# Patient Record
Sex: Female | Born: 1950 | Race: White | Hispanic: No | Marital: Married | Smoking: Former smoker
Health system: Southern US, Community
[De-identification: ages and names within clinical notes are randomized; demographics above are authoritative.]

## PROBLEM LIST (undated history)

## (undated) DIAGNOSIS — I10 Essential (primary) hypertension: Secondary | ICD-10-CM

## (undated) DIAGNOSIS — J189 Pneumonia, unspecified organism: Secondary | ICD-10-CM

## (undated) DIAGNOSIS — K219 Gastro-esophageal reflux disease without esophagitis: Secondary | ICD-10-CM

## (undated) DIAGNOSIS — J449 Chronic obstructive pulmonary disease, unspecified: Secondary | ICD-10-CM

## (undated) DIAGNOSIS — Z8719 Personal history of other diseases of the digestive system: Secondary | ICD-10-CM

## (undated) DIAGNOSIS — D649 Anemia, unspecified: Secondary | ICD-10-CM

## (undated) DIAGNOSIS — F32A Depression, unspecified: Secondary | ICD-10-CM

## (undated) DIAGNOSIS — R51 Headache: Secondary | ICD-10-CM

## (undated) DIAGNOSIS — G4730 Sleep apnea, unspecified: Secondary | ICD-10-CM

## (undated) DIAGNOSIS — F329 Major depressive disorder, single episode, unspecified: Secondary | ICD-10-CM

## (undated) HISTORY — PX: APPENDECTOMY: SHX54

## (undated) HISTORY — PX: DILATION AND CURETTAGE OF UTERUS: SHX78

## (undated) HISTORY — PX: CHOLECYSTECTOMY: SHX55

---

## 2013-03-26 ENCOUNTER — Institutional Professional Consult (permissible substitution): Payer: Self-pay | Admitting: Internal Medicine

## 2013-04-24 ENCOUNTER — Telehealth: Payer: Self-pay | Admitting: Internal Medicine

## 2013-04-24 ENCOUNTER — Inpatient Hospital Stay (HOSPITAL_COMMUNITY): Payer: BC Managed Care – PPO

## 2013-04-24 ENCOUNTER — Inpatient Hospital Stay (HOSPITAL_COMMUNITY)
Admission: AD | Admit: 2013-04-24 | Discharge: 2013-04-26 | DRG: 090 | Disposition: A | Discharge status: Home / Self Care | Payer: BC Managed Care – PPO | Source: Ambulatory Visit | Attending: Family Medicine | Admitting: Family Medicine

## 2013-04-24 DIAGNOSIS — K219 Gastro-esophageal reflux disease without esophagitis: Secondary | ICD-10-CM

## 2013-04-24 DIAGNOSIS — F329 Major depressive disorder, single episode, unspecified: Secondary | ICD-10-CM | POA: Diagnosis present

## 2013-04-24 DIAGNOSIS — Z87891 Personal history of nicotine dependence: Secondary | ICD-10-CM

## 2013-04-24 DIAGNOSIS — J189 Pneumonia, unspecified organism: Secondary | ICD-10-CM

## 2013-04-24 DIAGNOSIS — F3289 Other specified depressive episodes: Secondary | ICD-10-CM | POA: Diagnosis present

## 2013-04-24 DIAGNOSIS — Z23 Encounter for immunization: Secondary | ICD-10-CM

## 2013-04-24 DIAGNOSIS — Z8701 Personal history of pneumonia (recurrent): Secondary | ICD-10-CM

## 2013-04-24 DIAGNOSIS — I10 Essential (primary) hypertension: Secondary | ICD-10-CM | POA: Diagnosis present

## 2013-04-24 DIAGNOSIS — K2270 Barrett's esophagus without dysplasia: Secondary | ICD-10-CM

## 2013-04-24 HISTORY — DX: Personal history of other diseases of the digestive system: Z87.19

## 2013-04-24 HISTORY — DX: Gastro-esophageal reflux disease without esophagitis: K21.9

## 2013-04-24 HISTORY — DX: Major depressive disorder, single episode, unspecified: F32.9

## 2013-04-24 HISTORY — DX: Anemia, unspecified: D64.9

## 2013-04-24 HISTORY — DX: Chronic obstructive pulmonary disease, unspecified: J44.9

## 2013-04-24 HISTORY — DX: Depression, unspecified: F32.A

## 2013-04-24 HISTORY — DX: Sleep apnea, unspecified: G47.30

## 2013-04-24 HISTORY — DX: Headache: R51

## 2013-04-24 HISTORY — DX: Essential (primary) hypertension: I10

## 2013-04-24 HISTORY — DX: Pneumonia, unspecified organism: J18.9

## 2013-04-24 LAB — CBC WITH DIFFERENTIAL/PLATELET
Basophils Absolute: 0 10*3/uL (ref 0.0–0.1)
Basophils Relative: 0 % (ref 0–1)
Eosinophils Relative: 1 % (ref 0–5)
HCT: 33 % — ABNORMAL LOW (ref 36.0–46.0)
MCHC: 33.6 g/dL (ref 30.0–36.0)
MCV: 80.3 fL (ref 78.0–100.0)
Monocytes Absolute: 0.9 10*3/uL (ref 0.1–1.0)
Neutro Abs: 10.3 10*3/uL — ABNORMAL HIGH (ref 1.7–7.7)
Neutrophils Relative %: 75 % (ref 43–77)
Platelets: 163 10*3/uL (ref 150–400)
RBC: 4.11 MIL/uL (ref 3.87–5.11)
RDW: 13.1 % (ref 11.5–15.5)

## 2013-04-24 MED ORDER — DEXTROSE 5 % IV SOLN
1.0000 g | INTRAVENOUS | Status: DC
  Administered 2013-04-25 (×2): 1 g via INTRAVENOUS
  Filled 2013-04-24 (×3): qty 10

## 2013-04-24 MED ORDER — MORPHINE SULFATE 2 MG/ML IJ SOLN: 2.0000 mg | INTRAMUSCULAR | Status: DC | PRN

## 2013-04-24 MED ORDER — SODIUM CHLORIDE 0.9 % IJ SOLN
3.0000 mL | Freq: Two times a day (BID) | INTRAMUSCULAR | Status: DC
  Administered 2013-04-25 (×3): 3 mL via INTRAVENOUS

## 2013-04-24 MED ORDER — SODIUM CHLORIDE 0.9 % IV SOLN: 250.0000 mL | INTRAVENOUS | Status: DC | PRN

## 2013-04-24 MED ORDER — DOCUSATE SODIUM 100 MG PO CAPS
100.0000 mg | ORAL_CAPSULE | Freq: Two times a day (BID) | ORAL | Status: DC
  Administered 2013-04-25 – 2013-04-26 (×4): 100 mg via ORAL
  Filled 2013-04-24 (×5): qty 1

## 2013-04-24 MED ORDER — ZOLPIDEM TARTRATE 5 MG PO TABS: 5.0000 mg | ORAL_TABLET | Freq: Every evening | ORAL | Status: DC | PRN

## 2013-04-24 MED ORDER — SODIUM CHLORIDE 0.9 % IJ SOLN: 3.0000 mL | INTRAMUSCULAR | Status: DC | PRN

## 2013-04-24 MED ORDER — ONDANSETRON HCL 4 MG/2ML IJ SOLN: 4.0000 mg | Freq: Four times a day (QID) | INTRAMUSCULAR | Status: DC | PRN

## 2013-04-24 MED ORDER — ONDANSETRON HCL 4 MG PO TABS: 4.0000 mg | ORAL_TABLET | Freq: Four times a day (QID) | ORAL | Status: DC | PRN

## 2013-04-24 MED ORDER — DEXTROSE 5 % IV SOLN
500.0000 mg | INTRAVENOUS | Status: DC
  Administered 2013-04-25 (×2): 500 mg via INTRAVENOUS
  Filled 2013-04-24 (×3): qty 500

## 2013-04-24 NOTE — H&P (Signed)
[facility] History and Physical  [name] WUJ:[id number] DOB: [birth year]    PCP:   Deboraha Sprang Physician at [facility].  Chief Complaint: Direct admit for failed outpatient treatment of CAP.  HPI: Mallory West is an 62 y.o. female with hx of GERD, Barretts, prior significant tobacco use, recurrent left sided PNA, sent in by PCP for failed outpatient tx of CAP.   She had a course of Z pak and Augmentin, along with Rocephinx1 dose, and yet her CXR showed extension of her infiltrate to involve the LLL and lingula.  She has persistent coughs of green sputum.  She has slight shortness of breath.  She said she had several bouts of PNA in the past several months, and it has always been on the left side.  She has no new pet, distant travel, or ill contacts.  She has HTN for which she takes Ziac, but other than those PMH mentioned above, she is healthy. She denied alcohol or drug use.  She doesn't cough with eating or drinking.  Reportedly, her WBC rose to 17K.  She was asked to be direct admitted to [facility].  Rewiew of Systems:  Constitutional: Negative for malaise, fever and chills. No significant weight loss or weight gain Eyes: Negative for eye pain, redness and discharge, diplopia, visual changes, or flashes of light. ENMT: Negative for ear pain, hoarseness, nasal congestion, sinus pressure and sore throat. No headaches; tinnitus, drooling, or problem swallowing. Cardiovascular: Negative for chest pain, palpitations, diaphoresis, dyspnea and peripheral edema. ; No orthopnea, PND Respiratory: Negative for hemoptysis, wheezing and stridor.  Gastrointestinal: Negative for nausea, vomiting, diarrhea, constipation, abdominal pain, melena, blood in stool, hematemesis, jaundice and rectal bleeding.    Genitourinary: Negative for frequency, dysuria, incontinence,flank pain and hematuria; Musculoskeletal: Negative for back pain and neck pain. Negative for swelling and trauma.;  Skin: . Negative for  pruritus, rash, abrasions, bruising and skin lesion.; ulcerations Neuro: Negative for headache, lightheadedness and neck stiffness. Negative for weakness, altered level of consciousness , altered mental status, extremity weakness, burning feet, involuntary movement, seizure and syncope.  Psych: negative for anxiety, depression, insomnia, tearfulness, panic attacks, hallucinations, paranoia, suicidal or homicidal ideation    PMH  GERD, HTN, Barretts, recurrent PNAs.  Medications:  ZIAC, pending reconciliation.  HOME MEDS: Prior to Admission medications   Not on File     Allergies: NKDA  Social History: Prior Tobacco use, no drug.  Family History: No family hx of lung CA.   Physical Exam: Filed Vitals:   04/24/13 2127  BP: 107/49  Pulse: 76  Temp: 98.5 F (36.9 C)  TempSrc: Oral  Resp: 18  Height: 5\' 5"  (1.651 m)  Weight: 73.891 kg (162 lb 14.4 oz)  SpO2: 98%   Blood pressure 107/49, pulse 76, temperature 98.5 F (36.9 C), temperature source Oral, resp. rate 18, height 5\' 5"  (1.651 m), weight 73.891 kg (162 lb 14.4 oz), SpO2 98.00%.  GEN:  Pleasant  patient lying in the stretcher in no acute distress; cooperative with exam. PSYCH:  alert and oriented x4; does not appear anxious or depressed; affect is appropriate. HEENT: Mucous membranes pink and anicteric; PERRLA; EOM intact; no cervical lymphadenopathy nor thyromegaly or carotid bruit; no JVD; There were no stridor. Neck is very supple. Breasts:: Not examined CHEST WALL: No tenderness CHEST: Normal respiration, crackles on left mid and lower lung field. HEART: Regular rate and rhythm.  There are no murmur, rub, or gallops.   BACK: No kyphosis or scoliosis; no  CVA tenderness ABDOMEN: soft and non-tender; no masses, no organomegaly, normal abdominal bowel sounds; no pannus; no intertriginous candida. There is no rebound and no distention. Rectal Exam: Not done EXTREMITIES: No bone or joint deformity; age-appropriate  arthropathy of the hands and knees; no edema; no ulcerations.  There is no calf tenderness. Genitalia: not examined PULSES: 2+ and symmetric SKIN: Normal hydration no rash or ulceration CNS: Cranial nerves 2-12 grossly intact no focal lateralizing neurologic deficit.  Speech is fluent; uvula elevated with phonation, facial symmetry and tongue midline. DTR are normal bilaterally, cerebella exam is intact, barbinski is negative and strengths are equaled bilaterally.  No sensory loss.   Labs on Admission:  WBC 17K at PCP.  Assessment/Plan Present on Admission:  . HTN (hypertension) . Barrett's esophagus . GERD (gastroesophageal reflux disease) Recurrent PNA.  PLAN:  Will treat her with Rocephin and Zithromax.  Because of the recurrence of PNA on the same side, will obtain a chest CT to exclude post obt. PNA.   Will also get immunoglobulin levels and HIV.   Once her home meds are reconciled, will likely resume them.  She is stable, full code, and will be admitted to [facility] service.  Thank you for allowing me to participate in her care.  Other plans as per orders.  Code Status: FULL Unk Lightning, MD. [facility] Pager [phone] 7pm to 7am.  04/24/2013, 9:35 PM

## 2013-04-25 ENCOUNTER — Encounter (HOSPITAL_COMMUNITY): Payer: Self-pay | Admitting: General Practice

## 2013-04-25 DIAGNOSIS — K219 Gastro-esophageal reflux disease without esophagitis: Secondary | ICD-10-CM

## 2013-04-25 DIAGNOSIS — K2270 Barrett's esophagus without dysplasia: Secondary | ICD-10-CM

## 2013-04-25 DIAGNOSIS — I10 Essential (primary) hypertension: Secondary | ICD-10-CM

## 2013-04-25 DIAGNOSIS — J189 Pneumonia, unspecified organism: Principal | ICD-10-CM

## 2013-04-25 LAB — COMPREHENSIVE METABOLIC PANEL
ALT: 17 U/L (ref 0–35)
Alkaline Phosphatase: 92 U/L (ref 39–117)
BUN: 8 mg/dL (ref 6–23)
Chloride: 102 mEq/L (ref 96–112)
GFR calc Af Amer: 85 mL/min — ABNORMAL LOW (ref >90)
Glucose, Bld: 104 mg/dL — ABNORMAL HIGH (ref 70–99)
Potassium: 3.7 mEq/L (ref 3.5–5.1)
Sodium: 140 mEq/L (ref 135–145)
Total Bilirubin: 0.3 mg/dL (ref 0.3–1.2)
Total Protein: 6.6 g/dL (ref 6.0–8.3)

## 2013-04-25 LAB — HIV ANTIBODY (ROUTINE TESTING W REFLEX): HIV: NONREACTIVE

## 2013-04-25 MED ORDER — HYDROCHLOROTHIAZIDE 10 MG/ML ORAL SUSPENSION
6.2500 mg | Freq: Every day | ORAL | Status: DC
  Administered 2013-04-26: 6.25 mg via ORAL
  Filled 2013-04-25 (×2): qty 1.25

## 2013-04-25 MED ORDER — CYCLOBENZAPRINE HCL 10 MG PO TABS
10.0000 mg | ORAL_TABLET | Freq: Every day | ORAL | Status: DC
Start: 2013-04-25 — End: 2013-04-26
  Filled 2013-04-25 (×2): qty 1

## 2013-04-25 MED ORDER — BISOPROLOL FUMARATE 5 MG PO TABS
5.0000 mg | ORAL_TABLET | Freq: Every day | ORAL | Status: DC
  Administered 2013-04-26: 10:00:00 5 mg via ORAL
  Filled 2013-04-25 (×2): qty 1

## 2013-04-25 MED ORDER — ACETAMINOPHEN 500 MG PO TABS
500.0000 mg | ORAL_TABLET | Freq: Four times a day (QID) | ORAL | Status: DC | PRN
  Administered 2013-04-25: 500 mg via ORAL
  Filled 2013-04-25: qty 1

## 2013-04-25 MED ORDER — ENOXAPARIN SODIUM 40 MG/0.4ML ~~LOC~~ SOLN
40.0000 mg | SUBCUTANEOUS | Status: DC
  Filled 2013-04-25 (×2): qty 0.4

## 2013-04-25 MED ORDER — METHADONE HCL 10 MG PO TABS: 30.0000 mg | ORAL_TABLET | ORAL | Status: DC

## 2013-04-25 MED ORDER — BISOPROLOL-HYDROCHLOROTHIAZIDE 5-6.25 MG PO TABS: 1.0000 | ORAL_TABLET | Freq: Every day | ORAL | Status: DC

## 2013-04-25 MED ORDER — METHADONE HCL 10 MG PO TABS
60.0000 mg | ORAL_TABLET | Freq: Every day | ORAL | Status: DC
  Administered 2013-04-26: 60 mg via ORAL
  Filled 2013-04-25: qty 6

## 2013-04-25 MED ORDER — FLUCONAZOLE 150 MG PO TABS
150.0000 mg | ORAL_TABLET | Freq: Once | ORAL | Status: AC
  Administered 2013-04-26: 02:00:00 150 mg via ORAL
  Filled 2013-04-25: qty 1

## 2013-04-25 MED ORDER — FLUOXETINE HCL 20 MG PO TABS
40.0000 mg | ORAL_TABLET | Freq: Every day | ORAL | Status: DC
  Administered 2013-04-26: 40 mg via ORAL
  Filled 2013-04-25 (×2): qty 2

## 2013-04-25 MED ORDER — BUPROPION HCL ER (SR) 100 MG PO TB12
200.0000 mg | ORAL_TABLET | Freq: Every day | ORAL | Status: DC
  Administered 2013-04-26: 200 mg via ORAL
  Filled 2013-04-25 (×2): qty 2

## 2013-04-25 MED ORDER — POLYETHYLENE GLYCOL 3350 17 G PO PACK
17.0000 g | PACK | Freq: Every day | ORAL | Status: DC
  Administered 2013-04-25 – 2013-04-26 (×2): 17 g via ORAL
  Filled 2013-04-25 (×3): qty 1

## 2013-04-25 NOTE — Progress Notes (Signed)
[facility] PROGRESS NOTE  Mallory West ZOX:[id number] DOB: 03-Jan-1951 DOA: 04/24/2013 PCP: Pcp Not In System  Assessment/Plan: 1. Recurrent Pneumonia- secondary to ? aspiration . Patient has severe GERD and complains of waking from sleep with bad taste in mouth from reflux. Will continue with rocephin and zithromax 2. GERD- continue Dexilant 3. Hypertension- Continue Ziac, BP is well controlled at this time. 4. Depression- Continue Prozac and Wellbutrin  Code Status: *Full code Family Communication: *Discussed with patient and her husband at bedside Disposition Plan: Home when stable   Consultants:  None  Procedures:  None  Antibiotics:  Rocephin  Zithromax  HPI/Subjective: Patient seen and examined, admitted with recurrent pneumonia.She does have h/o GERD.  Objective: Filed Vitals:   04/25/13 0800  BP: 132/75  Pulse: 84  Temp: 98.3 F (36.8 C)  Resp: 16   No intake or output data in the 24 hours ending 04/25/13 1504 Filed Weights   04/24/13 2127  Weight: 73.891 kg (162 lb 14.4 oz)    Exam:   General:  Appears in no acute distress  Cardiovascular: S1S2 RRR  Respiratory: Clear bilaterally  Abdomen: Soft, nontender, no organomegaly  Musculoskeletal: No edema  Data Reviewed: Basic Metabolic Panel:  Recent Labs Lab 04/25/13 0641  NA 140  K 3.7  CL 102  CO2 29  GLUCOSE 104*  BUN 8  CREATININE 0.83  CALCIUM 8.7   Liver Function Tests:  Recent Labs Lab 04/25/13 0641  AST 17  ALT 17  ALKPHOS 92  BILITOT 0.3  PROT 6.6  ALBUMIN 3.0*   No results found for this basename: LIPASE, AMYLASE,  in the last 168 hours No results found for this basename: AMMONIA,  in the last 168 hours CBC:  Recent Labs Lab 04/24/13 2237  WBC 13.7*  NEUTROABS 10.3*  HGB 11.1*  HCT 33.0*  MCV 80.3  PLT 163   Cardiac Enzymes: No results found for this basename: CKTOTAL, CKMB, CKMBINDEX, TROPONINI,  in the last 168 hours BNP (last 3 results) No  results found for this basename: PROBNP,  in the last 8760 hours CBG: No results found for this basename: GLUCAP,  in the last 168 hours  No results found for this or any previous visit (from the past 240 hour(s)).   Studies: Ct Chest Wo Contrast  04/24/2013   CLINICAL DATA:  Left-sided chest pain. Recurrent pneumonia. Shortness of breath.  EXAM: CT CHEST WITHOUT CONTRAST  TECHNIQUE: Multidetector CT imaging of the chest was performed following the standard protocol without IV contrast.  COMPARISON:  04/24/2013 chest radiograph at [facility] at [facility]  FINDINGS: There are multiple, bilateral areas of patchy airspace opacity with bronchial wall thickening and interspersed ground-glass airspace opacity predominantly in the lingula and the left lower lobe. No pleural effusion.  Small pleural and suspect small pericardial effusion is present. Cholecystectomy clips partly visualized. Small hiatal hernia. Small mediastinal lymph nodes are identified.  No acute osseous finding.  IMPRESSION: Multilobar patchy airspace opacities most prominent in the lingula and left lower lobe with a configuration most typical for bronchopneumonia. Aspiration or hemorrhage could have a similar appearance. Radiographic resolution may take 4-6 weeks.   Electronically Signed   By: Christiana Pellant M.D.   On: 04/24/2013 23:24    Scheduled Meds: . azithromycin  500 mg Intravenous Q24H  . cefTRIAXone (ROCEPHIN)  IV  1 g Intravenous Q24H  . docusate sodium  100 mg Oral BID  . polyethylene glycol  17 g Oral Daily  .  sodium chloride  3 mL Intravenous Q12H   Continuous Infusions:   Principal Problem:   CAP (community acquired pneumonia) Active Problems:   HTN (hypertension)   History of tobacco abuse   Barrett's esophagus   GERD (gastroesophageal reflux disease)    Time spent: 25 min    [facility] S  [facility] Pager [phone]. If 7PM-7AM, please contact night-coverage at [email or web address],  password [facility] 04/25/2013, 3:04 PM  LOS: 1 day

## 2013-04-26 LAB — CBC WITH DIFFERENTIAL/PLATELET
Basophils Absolute: 0 10*3/uL (ref 0.0–0.1)
Basophils Relative: 0 % (ref 0–1)
Eosinophils Absolute: 0.2 10*3/uL (ref 0.0–0.7)
HCT: 35.1 % — ABNORMAL LOW (ref 36.0–46.0)
MCH: 27 pg (ref 26.0–34.0)
MCHC: 33 g/dL (ref 30.0–36.0)
Monocytes Absolute: 0.5 10*3/uL (ref 0.1–1.0)
Neutro Abs: 5.5 10*3/uL (ref 1.7–7.7)
Neutrophils Relative %: 73 % (ref 43–77)
RDW: 13.6 % (ref 11.5–15.5)
WBC: 7.5 10*3/uL (ref 4.0–10.5)

## 2013-04-26 MED ORDER — POLYETHYLENE GLYCOL 3350 17 G PO PACK: 17.0000 g | PACK | Freq: Every day | ORAL | Status: DC

## 2013-04-26 MED ORDER — MOXIFLOXACIN HCL 400 MG PO TABS: 400.0000 mg | ORAL_TABLET | Freq: Every day | ORAL | Status: DC

## 2013-04-26 NOTE — Discharge Summary (Signed)
Physician Discharge Summary  Mallory West [facility] YNW:[id number] DOB: 22-Jul-1951 DOA: 04/24/2013  PCP: Pcp Not In System  Admit date: 04/24/2013 Discharge date: 04/26/2013  Time spent: 50* minutes  Recommendations for Outpatient Follow-up:  1. *Follow up [facility] in 2 weels 2. Follow up PCP in 2 weeks  Discharge Diagnoses:  Principal Problem:   CAP (community acquired pneumonia) Active Problems:   HTN (hypertension)   History of tobacco abuse   Barrett's esophagus   GERD (gastroesophageal reflux disease)   Discharge Condition: *Stable  Diet recommendation: *Low salt diet  Filed Weights   04/24/13 2127  Weight: 73.891 kg (162 lb 14.4 oz)    History of present illness:  62 y.o. female with hx of GERD, Barretts, prior significant tobacco use, recurrent left sided PNA, sent in by PCP for failed outpatient tx of CAP. She had a course of Z pak and Augmentin, along with Rocephinx1 dose, and yet her CXR showed extension of her infiltrate to involve the LLL and lingula. She has persistent coughs of green sputum. She has slight shortness of breath. She said she had several bouts of PNA in the past several months, and it has always been on the left side. She has no new pet, distant travel, or ill contacts. She has HTN for which she takes Ziac, but other than those PMH mentioned above, she is healthy. She denied alcohol or drug use. She doesn't cough with eating or drinking. Reportedly, her WBC rose to 17K. She was asked to be direct admitted to [facility].      Hospital Course:   Recurrent pneumonia- CT chest was obtained as she has had multiple episodes of pneumonia. Discussed the CT Chest on phone with Dr Herma Carson pulmonary, and he recommends pulmonary follow up. They will call to make an appointment as outpatient. Patient has sever GERD so recurrent aspiration is one of the differential. Will discharge her on Avelox 400 mg po daily x 8 days. She was started on Rocephin and zithromax in the hospital.WBC is  now back to normal.  GERD; Continue with Dexilant  Hypertension-Continue with Ziac  Depression- Continue Prozac and Wellbutrin     Procedures:  *None  Consultations:  None  Discharge Exam: Filed Vitals:   04/26/13 0852  BP: 118/62  Pulse: 76  Temp: 98.5 F (36.9 C)  Resp: 18    General: *Appear in no acute distress Cardiovascular: *S1s2 RRR Respiratory: *Clear bilaterally Abdomen: Soft, nontender Ext : No edema  Discharge Instructions  Discharge Orders   Future Orders Complete By Expires   Diet - low sodium heart healthy  As directed    Increase activity slowly  As directed        Medication List         acetaminophen 500 MG tablet  Commonly known as:  TYLENOL  Take 1,000 mg by mouth every 6 (six) hours as needed for fever.     bisoprolol-hydrochlorothiazide 5-6.25 MG per tablet  Commonly known as:  ZIAC  Take 1 tablet by mouth daily.     buPROPion 100 MG 12 hr tablet  Commonly known as:  WELLBUTRIN SR  Take 200 mg by mouth daily.     cyclobenzaprine 10 MG tablet  Commonly known as:  FLEXERIL  Take 10 mg by mouth at bedtime.     dexlansoprazole 60 MG capsule  Commonly known as:  DEXILANT  Take 60 mg by mouth daily.     docusate sodium 50 MG capsule  Commonly known as:  COLACE  Take 250 mg by mouth at bedtime.     FLUoxetine 20 MG tablet  Commonly known as:  PROZAC  Take 40 mg by mouth daily.     methadone 10 MG tablet  Commonly known as:  DOLOPHINE  Take 30-60 mg by mouth every 8 (eight) hours. Take six in the morning and three at night     moxifloxacin 400 MG tablet  Commonly known as:  AVELOX  Take 1 tablet (400 mg total) by mouth daily.     polyethylene glycol packet  Commonly known as:  MIRALAX / GLYCOLAX  Take 17 g by mouth daily.     PROBIOTIC DAILY PO  Take 1 tablet by mouth at bedtime.       No Known Allergies    The results of significant diagnostics from this hospitalization (including imaging, microbiology,  ancillary and laboratory) are listed below for reference.    Significant Diagnostic Studies: Ct Chest Wo Contrast  04/24/2013   CLINICAL DATA:  Left-sided chest pain. Recurrent pneumonia. Shortness of breath.  EXAM: CT CHEST WITHOUT CONTRAST  TECHNIQUE: Multidetector CT imaging of the chest was performed following the standard protocol without IV contrast.  COMPARISON:  04/24/2013 chest radiograph at [facility] at [facility]  FINDINGS: There are multiple, bilateral areas of patchy airspace opacity with bronchial wall thickening and interspersed ground-glass airspace opacity predominantly in the lingula and the left lower lobe. No pleural effusion.  Small pleural and suspect small pericardial effusion is present. Cholecystectomy clips partly visualized. Small hiatal hernia. Small mediastinal lymph nodes are identified.  No acute osseous finding.  IMPRESSION: Multilobar patchy airspace opacities most prominent in the lingula and left lower lobe with a configuration most typical for bronchopneumonia. Aspiration or hemorrhage could have a similar appearance. Radiographic resolution may take 4-6 weeks.   Electronically Signed   By: Christiana Pellant M.D.   On: 04/24/2013 23:24    Microbiology: No results found for this or any previous visit (from the past 240 hour(s)).   Labs: Basic Metabolic Panel:  Recent Labs Lab 04/25/13 0641  NA 140  K 3.7  CL 102  CO2 29  GLUCOSE 104*  BUN 8  CREATININE 0.83  CALCIUM 8.7   Liver Function Tests:  Recent Labs Lab 04/25/13 0641  AST 17  ALT 17  ALKPHOS 92  BILITOT 0.3  PROT 6.6  ALBUMIN 3.0*   No results found for this basename: LIPASE, AMYLASE,  in the last 168 hours No results found for this basename: AMMONIA,  in the last 168 hours CBC:  Recent Labs Lab 04/24/13 2237 04/26/13 1000  WBC 13.7* 7.5  NEUTROABS 10.3* 5.5  HGB 11.1* 11.6*  HCT 33.0* 35.1*  MCV 80.3 81.8  PLT 163 160   Cardiac Enzymes: No results found  for this basename: CKTOTAL, CKMB, CKMBINDEX, TROPONINI,  in the last 168 hours BNP: BNP (last 3 results) No results found for this basename: PROBNP,  in the last 8760 hours CBG: No results found for this basename: GLUCAP,  in the last 168 hours     Signed:  Fonnie Crookshanks S  [facility] 04/26/2013, 11:59 AM

## 2013-04-27 NOTE — Progress Notes (Signed)
Utilization review completed. Raphel Stickles RN CCM Case Mgmt  

## 2013-04-28 ENCOUNTER — Institutional Professional Consult (permissible substitution): Payer: BC Managed Care – PPO | Admitting: Internal Medicine

## 2013-04-28 LAB — PROTEIN ELECTROPH W RFLX QUANT IMMUNOGLOBULINS
Albumin ELP: 53.9 % — ABNORMAL LOW (ref 55.8–66.1)
Alpha-1-Globulin: 7.3 % — ABNORMAL HIGH (ref 2.9–4.9)
Alpha-2-Globulin: 12.1 % — ABNORMAL HIGH (ref 7.1–11.8)
Beta 2: 4.4 % (ref 3.2–6.5)
M-Spike, %: NOT DETECTED g/dL
Total Protein ELP: 7.1 g/dL (ref 6.0–8.3)

## 2013-04-29 ENCOUNTER — Institutional Professional Consult (permissible substitution): Payer: BC Managed Care – PPO | Admitting: Internal Medicine

## 2013-05-04 ENCOUNTER — Institutional Professional Consult (permissible substitution): Payer: BC Managed Care – PPO | Admitting: Internal Medicine

## 2013-05-11 ENCOUNTER — Telehealth: Payer: Self-pay | Admitting: Internal Medicine

## 2013-05-11 ENCOUNTER — Ambulatory Visit (INDEPENDENT_AMBULATORY_CARE_PROVIDER_SITE_OTHER): Payer: BC Managed Care – PPO | Admitting: Internal Medicine

## 2013-05-11 ENCOUNTER — Ambulatory Visit (INDEPENDENT_AMBULATORY_CARE_PROVIDER_SITE_OTHER)
Admission: RE | Admit: 2013-05-11 | Discharge: 2013-05-11 | Disposition: A | Discharge status: Home / Self Care | Payer: BC Managed Care – PPO | Source: Ambulatory Visit | Attending: Internal Medicine | Admitting: Internal Medicine

## 2013-05-11 ENCOUNTER — Encounter: Payer: Self-pay | Admitting: Internal Medicine

## 2013-05-11 VITALS — BP 118/70 | HR 84 | Temp 98.3°F | Ht 65.0 in | Wt 165.4 lb

## 2013-05-11 DIAGNOSIS — Z23 Encounter for immunization: Secondary | ICD-10-CM

## 2013-05-11 DIAGNOSIS — J189 Pneumonia, unspecified organism: Secondary | ICD-10-CM

## 2013-05-11 NOTE — Telephone Encounter (Signed)
Pt will need to sign a medical release in order for [place] to do so. LMTCB x1 for pt to make her aware.

## 2013-05-11 NOTE — Patient Instructions (Signed)
Please see patient coordinator before you leave today  to schedule sinus CT and records from Dr Excell Seltzer  GERD (REFLUX)  is an extremely common cause of respiratory symptoms, many times with no significant heartburn at all.    It can be treated with medication, but also with lifestyle changes including avoidance of late meals, excessive alcohol, smoking cessation, and avoid fatty foods, chocolate, peppermint, colas, red wine, and acidic juices such as orange juice.  NO MINT OR MENTHOL PRODUCTS SO NO COUGH DROPS  USE SUGARLESS CANDY INSTEAD (jolley ranchers or Production designer, theatre/television/film)  NO OIL BASED VITAMINS - use powdered substitutes.  Please remember to go to the   x-ray department downstairs for your tests - we will call you with the results when they are available.     Please schedule a follow up office visit in 6 weeks, call sooner if needed

## 2013-05-11 NOTE — Progress Notes (Signed)
Subjective:    Patient ID: Mallory West, female    DOB: 1950/07/31   MRN: [id number]  HPI  89  yowf quit smoking 1999 with background hx of recurrent pna starting 7 then in her 40's every couple of years seemed worse p quit smoking always cleared between (though 4 x in 2013) then 5 x 2014 but persistent daily symptoms of cough x green mucus x 02/2013 w/u by pulmonary doctor in [facility] Feb 2014> "he never said what he though was wrong" failed outpt rx with augmentin/ zpak/ rocephin and admitted  Admit date: 04/24/2013  Discharge date: 04/26/2013  Time spent: 50* minutes  Recommendations for Outpatient Follow-up:  1. *Follow up [facility] in 2 weels 2. Follow up PCP in 2 weeks Discharge Diagnoses:  Principal Problem:  CAP (community acquired pneumonia) > rx Avelox  Active Problems:  HTN (hypertension)  History of tobacco abuse  Barrett's esophagus  GERD (gastroesophageal reflux disease)   05/11/2013 [facility] office visit/ Haidan Nhan cc daily cough x 2 months worst first thing am > dry after last abx x one week, no more fever.  Has h/o Barrett's on Reglan but couldn't tol it and no sense of HB/ asp on acid suppression and metadone daily.  Does have chronic nasal congestion and sinus pressure worst in am's.  No obvious day to day or daytime variabilty or assoc chronic cough or cp or chest tightness, subjective wheeze overt   hb symptoms. No unusual exp hx or h/o childhood pna/ asthma or knowledge of premature birth.  Sleeping ok without nocturnal  or early am exacerbation  of respiratory  c/o's or need for noct saba. Also denies any obvious fluctuation of symptoms with weather or environmental changes or other aggravating or alleviating factors except as outlined above   Current Medications, Allergies, Complete Past Medical History, Past Surgical History, Family History, and Social History were reviewed in [employer] record.          Review of Systems   Constitutional: Negative for fever and unexpected weight change.  HENT: Positive for congestion, postnasal drip and sinus pressure. Negative for dental problem, ear pain, nosebleeds, rhinorrhea, sneezing, sore throat and trouble swallowing.   Eyes: Negative for redness and itching.  Respiratory: Positive for cough. Negative for chest tightness, shortness of breath and wheezing.   Cardiovascular: Negative for palpitations and leg swelling.  Gastrointestinal: Negative for nausea and vomiting.  Genitourinary: Negative for dysuria.  Musculoskeletal: Negative for joint swelling.  Skin: Negative for rash.  Neurological: Positive for headaches.  Hematological: Does not bruise/bleed easily.  Psychiatric/Behavioral: Positive for dysphoric mood.       Objective:   Physical Exam  Wt Readings from Last 3 Encounters:  05/11/13 165 lb 6.4 oz (75.025 kg)  04/24/13 162 lb 14.4 oz (73.891 kg)    Pleasant amb wf with peppermint in mouth  HEENT: nl dentition, turbinates, and orophanx. Nl external ear canals without cough reflex   NECK :  without JVD/Nodes/TM/ nl carotid upstrokes bilaterally   LUNGS: no acc muscle use, clear to A and P bilaterally without cough on insp or exp maneuvers   CV:  RRR  no s3 or murmur or increase in P2, no edema   ABD:  soft and nontender with nl excursion in the supine position. No bruits or organomegaly, bowel sounds nl  MS:  warm without deformities, calf tenderness, cyanosis or clubbing  SKIN: warm and dry without lesions    NEURO:  alert, approp, no deficits      04/24/13 CT chest Multilobar patchy airspace opacities most prominent in the lingula  and left lower lobe with a configuration most typical for  bronchopneumonia.  05/11/13 cxr Improvement in the left lower lobe pneumonia       Assessment & Plan:

## 2013-05-12 NOTE — Telephone Encounter (Signed)
Records are in your lookat  The pt is aware She would like for you to review and advise on test results that were sent She states that she moved before getting any results Please advise, thanks!

## 2013-05-12 NOTE — Assessment & Plan Note (Signed)
Agree that gerd/ asp syndromes highest on the list esp since has h/o barrett's and is on methadone which may reduce the gag reflex when supine  Since already on acid suppression needs to do more to modify lifestyle/ diet / reviewed    Each maintenance medication was reviewed in detail including most importantly the difference between maintenance and as needed and under what circumstances the prns are to be used.  Please see instructions for details which were reviewed in writing and the patient given a copy.    Records requested, sinus ct pending/  rx also with flu and pneuomax and plan Prevar at 63

## 2013-05-12 NOTE — Telephone Encounter (Signed)
Called, spoke with pt.  She was seen yesterday.  Pt states she did sign release yesterday to have this taken care of.  She would like to ensure this is received.  Verlon Au, will you please let us know when you see this come across so we can let pt know?  Thank you.

## 2013-05-13 ENCOUNTER — Encounter: Payer: Self-pay | Admitting: Internal Medicine

## 2013-05-13 DIAGNOSIS — G4734 Idiopathic sleep related nonobstructive alveolar hypoventilation: Secondary | ICD-10-CM | POA: Insufficient documentation

## 2013-05-13 NOTE — Telephone Encounter (Signed)
Pt is aware that all MW has received is the initial evaluation. Do you have the records release that the pt signed?

## 2013-05-13 NOTE — Telephone Encounter (Signed)
Let her know all I have so far is the initial eval, none of the results from the tests he ordered, so have resubmitted request today

## 2013-05-13 NOTE — Telephone Encounter (Signed)
The labs were ok but not that extensive > we will regroup and decide on next steps at next ov  The 02 was low enough to rec 02 when test was done but if breathing better now that when test was done ok to try off and see how she feels

## 2013-05-13 NOTE — Telephone Encounter (Addendum)
Called and spoke with the pt  I advised that we received her initial eval- ov note, cxr report and ONO on RA But, in the ov note the doc rec that she have PFT and CT Chest Pt states that these were never done, but she did have some labs done  I called and spoke with Denny Peon at Dr [employer] office and verified this  She states no PFT or CT Chest were done, but she will fax the labs  Will hold in my basket until received  Received the labs- CBC, IGG, IGA and IGM   Dr Sherene Sires, I put these records back in your lookat Pt wants you to review them and advise any recs- she is not aware if needs to cont noct o2 since she was never called with ONO RA results  Also note that she did have a CT Chest here on 04/24/13, thanks!

## 2013-05-13 NOTE — Telephone Encounter (Signed)
Spoke with pt and notified of results/recs per Dr. Sherene Sires. Pt verbalized understanding and denied any questions. She will try off o2 and will discuss this at next visit

## 2013-05-14 ENCOUNTER — Other Ambulatory Visit: Payer: BC Managed Care – PPO

## 2013-05-20 ENCOUNTER — Other Ambulatory Visit: Payer: BC Managed Care – PPO

## 2013-05-27 ENCOUNTER — Ambulatory Visit (INDEPENDENT_AMBULATORY_CARE_PROVIDER_SITE_OTHER)
Admission: RE | Admit: 2013-05-27 | Discharge: 2013-05-27 | Disposition: A | Discharge status: Home / Self Care | Payer: BC Managed Care – PPO | Source: Ambulatory Visit | Attending: Internal Medicine | Admitting: Internal Medicine

## 2013-05-27 ENCOUNTER — Encounter: Payer: Self-pay | Admitting: Internal Medicine

## 2013-05-27 DIAGNOSIS — J189 Pneumonia, unspecified organism: Secondary | ICD-10-CM

## 2013-05-27 NOTE — Progress Notes (Signed)
Quick Note:  Spoke with pt and notified of results per Dr. Wert. Pt verbalized understanding and denied any questions.  ______ 

## 2013-06-22 ENCOUNTER — Ambulatory Visit: Payer: BC Managed Care – PPO | Admitting: Internal Medicine

## 2013-06-24 ENCOUNTER — Ambulatory Visit: Payer: BC Managed Care – PPO | Admitting: Internal Medicine

## 2013-12-10 ENCOUNTER — Telehealth: Payer: Self-pay | Admitting: Cardiology

## 2013-12-10 NOTE — Telephone Encounter (Signed)
[facility] [phone]  5.21.15/km

## 2013-12-31 ENCOUNTER — Encounter: Payer: Self-pay | Admitting: *Deleted

## 2014-01-04 ENCOUNTER — Ambulatory Visit: Payer: BC Managed Care – PPO | Admitting: Cardiology

## 2014-01-24 ENCOUNTER — Inpatient Hospital Stay (HOSPITAL_COMMUNITY)
Admission: EM | Admit: 2014-01-24 | Discharge: 2014-01-25 | DRG: 065 | Disposition: A | Discharge status: Home / Self Care | Payer: BC Managed Care – PPO | Attending: Internal Medicine | Admitting: Internal Medicine

## 2014-01-24 ENCOUNTER — Encounter (HOSPITAL_COMMUNITY): Payer: Self-pay | Admitting: Emergency Medicine

## 2014-01-24 ENCOUNTER — Emergency Department (HOSPITAL_COMMUNITY): Payer: BC Managed Care – PPO

## 2014-01-24 DIAGNOSIS — I10 Essential (primary) hypertension: Secondary | ICD-10-CM | POA: Diagnosis present

## 2014-01-24 DIAGNOSIS — G459 Transient cerebral ischemic attack, unspecified: Secondary | ICD-10-CM

## 2014-01-24 DIAGNOSIS — Z87891 Personal history of nicotine dependence: Secondary | ICD-10-CM

## 2014-01-24 DIAGNOSIS — I639 Cerebral infarction, unspecified: Secondary | ICD-10-CM | POA: Diagnosis present

## 2014-01-24 DIAGNOSIS — J449 Chronic obstructive pulmonary disease, unspecified: Secondary | ICD-10-CM | POA: Diagnosis present

## 2014-01-24 DIAGNOSIS — R200 Anesthesia of skin: Secondary | ICD-10-CM

## 2014-01-24 DIAGNOSIS — Z79899 Other long term (current) drug therapy: Secondary | ICD-10-CM

## 2014-01-24 DIAGNOSIS — Z881 Allergy status to other antibiotic agents status: Secondary | ICD-10-CM

## 2014-01-24 DIAGNOSIS — G4733 Obstructive sleep apnea (adult) (pediatric): Secondary | ICD-10-CM | POA: Diagnosis present

## 2014-01-24 DIAGNOSIS — G458 Other transient cerebral ischemic attacks and related syndromes: Secondary | ICD-10-CM

## 2014-01-24 DIAGNOSIS — K2270 Barrett's esophagus without dysplasia: Secondary | ICD-10-CM

## 2014-01-24 DIAGNOSIS — J4489 Other specified chronic obstructive pulmonary disease: Secondary | ICD-10-CM | POA: Diagnosis present

## 2014-01-24 DIAGNOSIS — Z9089 Acquired absence of other organs: Secondary | ICD-10-CM

## 2014-01-24 DIAGNOSIS — I6350 Cerebral infarction due to unspecified occlusion or stenosis of unspecified cerebral artery: Principal | ICD-10-CM | POA: Diagnosis present

## 2014-01-24 DIAGNOSIS — J189 Pneumonia, unspecified organism: Secondary | ICD-10-CM

## 2014-01-24 DIAGNOSIS — E785 Hyperlipidemia, unspecified: Secondary | ICD-10-CM | POA: Diagnosis present

## 2014-01-24 DIAGNOSIS — F329 Major depressive disorder, single episode, unspecified: Secondary | ICD-10-CM | POA: Diagnosis present

## 2014-01-24 DIAGNOSIS — Z800 Family history of malignant neoplasm of digestive organs: Secondary | ICD-10-CM

## 2014-01-24 DIAGNOSIS — I5032 Chronic diastolic (congestive) heart failure: Secondary | ICD-10-CM | POA: Diagnosis present

## 2014-01-24 DIAGNOSIS — Z823 Family history of stroke: Secondary | ICD-10-CM

## 2014-01-24 DIAGNOSIS — F3289 Other specified depressive episodes: Secondary | ICD-10-CM | POA: Diagnosis present

## 2014-01-24 DIAGNOSIS — R209 Unspecified disturbances of skin sensation: Secondary | ICD-10-CM | POA: Diagnosis present

## 2014-01-24 DIAGNOSIS — F32A Depression, unspecified: Secondary | ICD-10-CM | POA: Diagnosis present

## 2014-01-24 DIAGNOSIS — G451 Carotid artery syndrome (hemispheric): Secondary | ICD-10-CM

## 2014-01-24 DIAGNOSIS — Z7982 Long term (current) use of aspirin: Secondary | ICD-10-CM

## 2014-01-24 DIAGNOSIS — K219 Gastro-esophageal reflux disease without esophagitis: Secondary | ICD-10-CM | POA: Diagnosis present

## 2014-01-24 DIAGNOSIS — Z886 Allergy status to analgesic agent status: Secondary | ICD-10-CM

## 2014-01-24 LAB — CBC
HEMATOCRIT: 37.7 % (ref 36.0–46.0)
Hemoglobin: 12.5 g/dL (ref 12.0–15.0)
MCH: 27 pg (ref 26.0–34.0)
MCHC: 33.2 g/dL (ref 30.0–36.0)
MCV: 81.4 fL (ref 78.0–100.0)
Platelets: 183 10*3/uL (ref 150–400)
RBC: 4.63 MIL/uL (ref 3.87–5.11)
RDW: 12.9 % (ref 11.5–15.5)
WBC: 9.3 10*3/uL (ref 4.0–10.5)

## 2014-01-24 LAB — BASIC METABOLIC PANEL
Anion gap: 15 (ref 5–15)
BUN: 8 mg/dL (ref 6–23)
CHLORIDE: 96 meq/L (ref 96–112)
CO2: 24 meq/L (ref 19–32)
CREATININE: 1.06 mg/dL (ref 0.50–1.10)
Calcium: 9.7 mg/dL (ref 8.4–10.5)
GFR calc Af Amer: 63 mL/min — ABNORMAL LOW (ref >90)
GFR calc non Af Amer: 54 mL/min — ABNORMAL LOW (ref >90)
Glucose, Bld: 129 mg/dL — ABNORMAL HIGH (ref 70–99)
Potassium: 4.3 mEq/L (ref 3.7–5.3)
Sodium: 135 mEq/L — ABNORMAL LOW (ref 137–147)

## 2014-01-24 LAB — URINALYSIS, ROUTINE W REFLEX MICROSCOPIC
BILIRUBIN URINE: NEGATIVE
Glucose, UA: NEGATIVE mg/dL
HGB URINE DIPSTICK: NEGATIVE
Ketones, ur: NEGATIVE mg/dL
NITRITE: NEGATIVE
PROTEIN: NEGATIVE mg/dL
Specific Gravity, Urine: 1.004 — ABNORMAL LOW (ref 1.005–1.030)
UROBILINOGEN UA: 0.2 mg/dL (ref 0.0–1.0)
pH: 5.5 (ref 5.0–8.0)

## 2014-01-24 LAB — RAPID URINE DRUG SCREEN, HOSP PERFORMED
AMPHETAMINES: NOT DETECTED
BARBITURATES: NOT DETECTED
Benzodiazepines: NOT DETECTED
COCAINE: NOT DETECTED
OPIATES: NOT DETECTED
Tetrahydrocannabinol: NOT DETECTED

## 2014-01-24 LAB — PROTIME-INR
INR: 1.01 (ref 0.00–1.49)
Prothrombin Time: 13.3 seconds (ref 11.6–15.2)

## 2014-01-24 LAB — URINE MICROSCOPIC-ADD ON

## 2014-01-24 LAB — TROPONIN I: Troponin I: <0.3 ng/mL (ref <0.30)

## 2014-01-24 LAB — APTT: aPTT: 29 seconds (ref 24–37)

## 2014-01-24 LAB — ETHANOL: Alcohol, Ethyl (B): <11 mg/dL (ref 0–11)

## 2014-01-24 NOTE — ED Notes (Signed)
Doctors notified of patient.

## 2014-01-24 NOTE — ED Provider Notes (Signed)
CSN: [id number]     Arrival date & time 01/24/14  1957 History   First MD Initiated Contact with Patient 01/24/14 2108     Chief Complaint  Patient presents with  . Numbness     (Consider location/radiation/quality/duration/timing/severity/associated sxs/prior Treatment) HPI Pt presents with c/o numbness of her right face and right side of tongue/lips as well as numbness of right hand.  She states symptoms began this morning approx 9am and lasted 30 minutes.  Then resolved, recurred tonight approx 7pm and lasted approx one hour. Now only has numbness on right side of her lips.  No changes in vision or speech.  No focal weakness. No fever/chills.  No headache.  She has not had sympotms similar to this in the past.  There are no other associated systemic symptoms, there are no other alleviating or modifying factors.   Past Medical History  Diagnosis Date  . Hypertension   . Depression   . COPD (chronic obstructive pulmonary disease)   . Pneumonia   . Sleep apnea   . GERD (gastroesophageal reflux disease)   . H/O hiatal hernia   . Headache(784.0)   . Anemia    Past Surgical History  Procedure Laterality Date  . Cholecystectomy    . Appendectomy    . Dilation and curettage of uterus     Family History  Problem Relation Age of Onset  . Colon cancer Maternal Grandmother   . Stroke Father    History  Substance Use Topics  . Smoking status: Former Smoker -- 2.00 packs/day for 30 years    Types: Cigarettes    Quit date: 01/23/1998  . Smokeless tobacco: Never Used  . Alcohol Use: No   OB History   Grav Para Term Preterm Abortions TAB SAB Ect Mult Living                 Review of Systems ROS reviewed and all otherwise negative except for mentioned in HPI    Allergies  Aspirin; Amoxicillin; and Augmentin  Home Medications   Prior to Admission medications   Medication Sig Start Date End Date Taking? Authorizing Provider  acetaminophen (TYLENOL) 500 MG tablet Take 1,000  mg by mouth every 6 (six) hours as needed for fever.   Yes Historical Provider, MD  bisoprolol-hydrochlorothiazide (ZIAC) 5-6.25 MG per tablet Take 1 tablet by mouth daily.   Yes Historical Provider, MD  buPROPion (WELLBUTRIN SR) 100 MG 12 hr tablet Take 300 mg by mouth daily.    Yes Historical Provider, MD  cyclobenzaprine (FLEXERIL) 10 MG tablet Take 10 mg by mouth at bedtime.   Yes Historical Provider, MD  dexlansoprazole (DEXILANT) 60 MG capsule Take 60 mg by mouth daily.   Yes Historical Provider, MD  docusate sodium (COLACE) 50 MG capsule Take 250 mg by mouth at bedtime.   Yes Historical Provider, MD  FLUoxetine (PROZAC) 20 MG tablet Take 40 mg by mouth daily.   Yes Historical Provider, MD  MELATONIN PO Take 1 tablet by mouth at bedtime as needed (sleep.).   Yes Historical Provider, MD  methadone (DOLOPHINE) 10 MG tablet Take 30-60 mg by mouth every 8 (eight) hours. Take six in the morning and three at night   Yes Historical Provider, MD  sulfamethoxazole-trimethoprim (BACTRIM DS) 800-160 MG per tablet Take 1 tablet by mouth 2 (two) times daily. For 7 days. 01/18/14  Yes Historical Provider, MD   BP 174/71  Temp(Src) 98.5 F (36.9 C) (Oral)  Resp 27  Ht 5'  3" (1.6 m)  Wt 158 lb (71.668 kg)  BMI 28.00 kg/m2  SpO2 100% Vitals reviewed Physical Exam Physical Examination: General appearance - alert, well appearing, and in no distress Mental status - alert, oriented to person, place, and time Eyes - pupils equal and reactive, extraocular eye movements intact Mouth - mucous membranes moist, pharynx normal without lesions Chest - clear to auscultation, no wheezes, rales or rhonchi, symmetric air entry Heart - normal rate, regular rhythm, normal S1, S2, no murmurs, rubs, clicks or gallops Abdomen - soft, nontender, nondistended, no masses or organomegaly Neurological - alert, oriented x 3, cranial nerves 2-12 tested and intact, strength 5/5 in extremities x 4, sensation intact Extremities -  peripheral pulses normal, no pedal edema, no clubbing or cyanosis Skin - normal coloration and turgor, no rashes  ED Course  Procedures (including critical care time)  11:49 PM d/w Dr. Leroy Kennedy- he agrees with TIA workup and recommends [facility] due to stuttering nature of symptoms and the ability to intervene if symptoms worsen at [facility].    11:56 PM d/w [facility], Dr. Roe Rutherford - he will admit [facility] for further workup.   Labs Review Labs Reviewed  BASIC METABOLIC PANEL - Abnormal; Notable for the following:    Sodium 135 (*)    Glucose, Bld 129 (*)    GFR calc non Af Amer 54 (*)    GFR calc Af Amer 63 (*)    All other components within normal limits  URINALYSIS, ROUTINE W REFLEX MICROSCOPIC - Abnormal; Notable for the following:    Specific Gravity, Urine 1.004 (*)    Leukocytes, UA TRACE (*)    All other components within normal limits  URINE MICROSCOPIC-ADD ON - Abnormal; Notable for the following:    Squamous Epithelial / LPF FEW (*)    All other components within normal limits  CBC  PROTIME-INR  APTT  ETHANOL  URINE RAPID DRUG SCREEN (HOSP PERFORMED)  TROPONIN I    Imaging Review Ct Head Wo Contrast  01/24/2014   CLINICAL DATA:  Numbness in tongue and lips.  EXAM: CT HEAD WITHOUT CONTRAST  TECHNIQUE: Contiguous axial images were obtained from the base of the skull through the vertex without intravenous contrast.  COMPARISON:  CT of the sinuses May 27, 2013  FINDINGS: The ventricles and sulci are normal for age. No intraparenchymal hemorrhage, mass effect nor midline shift. Patchy supratentorial white matter hypodensities are greater than expected for patient's age and though non-specific suggest sequelae of chronic small vessel ischemic disease. No acute large vascular territory infarcts.  No abnormal extra-axial fluid collections. Basal cisterns are patent. Mild calcific atherosclerosis of the carotid siphons.  No skull fracture. The included ocular globes and orbital  contents are non-suspicious. The mastoid aircells and included paranasal sinuses are well-aerated.  IMPRESSION: No acute intracranial process. If clinical concern for acute ischemia, MRI of the brain with diffusion-weighted sequences would be more sensitive.  Mild to moderate white matter changes suggest chronic small vessel ischemic disease.   Electronically Signed   By: Awilda Metro   On: 01/24/2014 22:51     EKG Interpretation   Date/Time:  Sunday January 24 2014 19:59:32 EDT Ventricular Rate:  73 PR Interval:  145 QRS Duration: 78 QT Interval:  400 QTC Calculation: 441 R Axis:   34 Text Interpretation:  Sinus rhythm No old tracing to compare Confirmed by  [facility]  MD, Vannak Montenegro [phone]) on 01/24/2014 11:17:03 PM      MDM  Final diagnoses:  Hemispheric carotid artery syndrome  Barrett's esophagus  CAP (community acquired pneumonia)  History of tobacco abuse    Pt presenting with c/o numbness of right face and tongue as well as right hand.  No weakness.  Head CT reassuring.  Workup reassuring thus far.  D/w Dr. Leroy Kennedyamilo and patient to be admitted [facility] and transferred [facility] for further workup.      Ethelda ChickMartha K Linker, MD 01/25/14 0001

## 2014-01-24 NOTE — ED Notes (Signed)
Pt arrived to the ED with a complaint of right sided hand and face numbness.  Pt states she had the sensation of numbness with tingling earlier this am and then it went away.  Pt then drove home and upon arrival back home had the sensation again.

## 2014-01-25 ENCOUNTER — Inpatient Hospital Stay (HOSPITAL_COMMUNITY): Payer: BC Managed Care – PPO

## 2014-01-25 DIAGNOSIS — I10 Essential (primary) hypertension: Secondary | ICD-10-CM

## 2014-01-25 DIAGNOSIS — I6350 Cerebral infarction due to unspecified occlusion or stenosis of unspecified cerebral artery: Principal | ICD-10-CM

## 2014-01-25 DIAGNOSIS — G4733 Obstructive sleep apnea (adult) (pediatric): Secondary | ICD-10-CM | POA: Diagnosis present

## 2014-01-25 DIAGNOSIS — F32A Depression, unspecified: Secondary | ICD-10-CM | POA: Diagnosis present

## 2014-01-25 DIAGNOSIS — J449 Chronic obstructive pulmonary disease, unspecified: Secondary | ICD-10-CM | POA: Diagnosis present

## 2014-01-25 DIAGNOSIS — E785 Hyperlipidemia, unspecified: Secondary | ICD-10-CM

## 2014-01-25 DIAGNOSIS — F329 Major depressive disorder, single episode, unspecified: Secondary | ICD-10-CM | POA: Diagnosis present

## 2014-01-25 DIAGNOSIS — I5032 Chronic diastolic (congestive) heart failure: Secondary | ICD-10-CM

## 2014-01-25 DIAGNOSIS — I517 Cardiomegaly: Secondary | ICD-10-CM

## 2014-01-25 LAB — CBC
HCT: 39.7 % (ref 36.0–46.0)
Hemoglobin: 12.8 g/dL (ref 12.0–15.0)
MCH: 27 pg (ref 26.0–34.0)
MCHC: 32.2 g/dL (ref 30.0–36.0)
MCV: 83.8 fL (ref 78.0–100.0)
PLATELETS: 173 10*3/uL (ref 150–400)
RBC: 4.74 MIL/uL (ref 3.87–5.11)
RDW: 13 % (ref 11.5–15.5)
WBC: 6.6 10*3/uL (ref 4.0–10.5)

## 2014-01-25 LAB — LIPID PANEL
Cholesterol: 220 mg/dL — ABNORMAL HIGH (ref 0–200)
HDL: 52 mg/dL (ref >39)
LDL Cholesterol: 139 mg/dL — ABNORMAL HIGH (ref 0–99)
Total CHOL/HDL Ratio: 4.2 RATIO
Triglycerides: 147 mg/dL (ref <150)
VLDL: 29 mg/dL (ref 0–40)

## 2014-01-25 LAB — HEMOGLOBIN A1C
Hgb A1c MFr Bld: 6.2 % — ABNORMAL HIGH (ref <5.7)
Mean Plasma Glucose: 131 mg/dL — ABNORMAL HIGH (ref <117)

## 2014-01-25 LAB — CREATININE, SERUM
Creatinine, Ser: 0.99 mg/dL (ref 0.50–1.10)
GFR calc non Af Amer: 59 mL/min — ABNORMAL LOW (ref >90)
GFR, EST AFRICAN AMERICAN: 68 mL/min — AB (ref >90)

## 2014-01-25 LAB — PRO B NATRIURETIC PEPTIDE: PRO B NATRI PEPTIDE: 417 pg/mL — AB (ref 0–125)

## 2014-01-25 MED ORDER — LORAZEPAM 2 MG/ML IJ SOLN
INTRAMUSCULAR | Status: AC
  Administered 2014-01-25: 0.5 mg via INTRAVENOUS
  Filled 2014-01-25: qty 1

## 2014-01-25 MED ORDER — BUPROPION HCL ER (SR) 150 MG PO TB12
300.0000 mg | ORAL_TABLET | Freq: Every day | ORAL | Status: DC
  Administered 2014-01-25: 300 mg via ORAL
  Filled 2014-01-25: qty 2

## 2014-01-25 MED ORDER — DEXLANSOPRAZOLE 60 MG PO CPDR
60.0000 mg | DELAYED_RELEASE_CAPSULE | Freq: Every day | ORAL | Status: DC
  Administered 2014-01-25: 60 mg via ORAL
  Filled 2014-01-25: qty 1

## 2014-01-25 MED ORDER — FUROSEMIDE 20 MG PO TABS
20.0000 mg | ORAL_TABLET | ORAL | Status: AC
  Administered 2014-01-25: 20 mg via ORAL
  Filled 2014-01-25: qty 1

## 2014-01-25 MED ORDER — ACETAMINOPHEN 500 MG PO TABS
1000.0000 mg | ORAL_TABLET | Freq: Four times a day (QID) | ORAL | Status: DC | PRN
  Administered 2014-01-25: 1000 mg via ORAL

## 2014-01-25 MED ORDER — ENOXAPARIN SODIUM 40 MG/0.4ML ~~LOC~~ SOLN
40.0000 mg | SUBCUTANEOUS | Status: DC
  Administered 2014-01-25: 40 mg via SUBCUTANEOUS
  Filled 2014-01-25: qty 0.4

## 2014-01-25 MED ORDER — LORAZEPAM 2 MG/ML IJ SOLN
0.5000 mg | Freq: Once | INTRAMUSCULAR | Status: AC
  Administered 2014-01-25: 0.5 mg via INTRAVENOUS

## 2014-01-25 MED ORDER — DOCUSATE SODIUM 50 MG PO CAPS
250.0000 mg | ORAL_CAPSULE | Freq: Every day | ORAL | Status: DC
  Filled 2014-01-25: qty 1

## 2014-01-25 MED ORDER — LISINOPRIL 2.5 MG PO TABS: 2.5000 mg | ORAL_TABLET | Freq: Every day | ORAL | Status: AC

## 2014-01-25 MED ORDER — BISOPROLOL-HYDROCHLOROTHIAZIDE 5-6.25 MG PO TABS
1.0000 | ORAL_TABLET | Freq: Every day | ORAL | Status: DC
  Administered 2014-01-25: 1 via ORAL
  Filled 2014-01-25: qty 1

## 2014-01-25 MED ORDER — ASPIRIN 81 MG PO TABS: 81.0000 mg | ORAL_TABLET | Freq: Every day | ORAL | Status: AC

## 2014-01-25 MED ORDER — SENNOSIDES-DOCUSATE SODIUM 8.6-50 MG PO TABS: 1.0000 | ORAL_TABLET | Freq: Every evening | ORAL | Status: DC | PRN

## 2014-01-25 MED ORDER — METHADONE HCL 10 MG PO TABS
30.0000 mg | ORAL_TABLET | ORAL | Status: DC
  Administered 2014-01-25: 30 mg via ORAL
  Filled 2014-01-25: qty 3

## 2014-01-25 MED ORDER — ASPIRIN 300 MG RE SUPP: 300.0000 mg | Freq: Every day | RECTAL | Status: DC

## 2014-01-25 MED ORDER — STROKE: EARLY STAGES OF RECOVERY BOOK
Freq: Once | Status: AC
  Administered 2014-01-25: 04:00:00
  Filled 2014-01-25: qty 1

## 2014-01-25 MED ORDER — METHADONE HCL 10 MG PO TABS
60.0000 mg | ORAL_TABLET | Freq: Every day | ORAL | Status: DC
  Administered 2014-01-25: 60 mg via ORAL
  Filled 2014-01-25: qty 6

## 2014-01-25 MED ORDER — NON FORMULARY: 60.0000 mg | Freq: Every day | Status: DC

## 2014-01-25 MED ORDER — CYCLOBENZAPRINE HCL 10 MG PO TABS: 10.0000 mg | ORAL_TABLET | Freq: Every day | ORAL | Status: DC

## 2014-01-25 MED ORDER — PANTOPRAZOLE SODIUM 40 MG PO TBEC: 40.0000 mg | DELAYED_RELEASE_TABLET | Freq: Every day | ORAL | Status: DC

## 2014-01-25 MED ORDER — FAMOTIDINE 20 MG PO TABS
20.0000 mg | ORAL_TABLET | Freq: Two times a day (BID) | ORAL | Status: DC
Start: 2014-01-25 — End: 2014-01-25
  Administered 2014-01-25: 20 mg via ORAL
  Filled 2014-01-25 (×2): qty 1

## 2014-01-25 MED ORDER — ATORVASTATIN CALCIUM 10 MG PO TABS: 10.0000 mg | ORAL_TABLET | Freq: Every day | ORAL | Status: AC

## 2014-01-25 MED ORDER — FAMOTIDINE 20 MG PO TABS: 20.0000 mg | ORAL_TABLET | Freq: Two times a day (BID) | ORAL | Status: DC

## 2014-01-25 MED ORDER — ATORVASTATIN CALCIUM 10 MG PO TABS
10.0000 mg | ORAL_TABLET | Freq: Every day | ORAL | Status: DC
  Administered 2014-01-25: 10 mg via ORAL
  Filled 2014-01-25: qty 1

## 2014-01-25 MED ORDER — ASPIRIN 325 MG PO TABS
325.0000 mg | ORAL_TABLET | Freq: Every day | ORAL | Status: DC
  Administered 2014-01-25: 325 mg via ORAL
  Filled 2014-01-25: qty 1

## 2014-01-25 MED ORDER — FLUOXETINE HCL 20 MG PO TABS
40.0000 mg | ORAL_TABLET | Freq: Every day | ORAL | Status: DC
  Administered 2014-01-25: 40 mg via ORAL
  Filled 2014-01-25 (×2): qty 2

## 2014-01-25 MED ORDER — METHADONE HCL 10 MG PO TABS: 30.0000 mg | ORAL_TABLET | Freq: Every day | ORAL | Status: DC

## 2014-01-25 NOTE — Progress Notes (Signed)
EEG Completed; Results Pending  

## 2014-01-25 NOTE — Discharge Instructions (Signed)

## 2014-01-25 NOTE — Consult Note (Signed)
Referring Physician: [facility]    Chief Complaint: intermittent tingling left face-lips-tongue and left fingers.  HPI:                                                                                                                                         Mallory West is an 63 y.o. female with a past medical history significant for HTN, sleep apnea, migraine, depression, COPD, transferred to [facility] for further evaluation and management of the above stated symptoms. She said that she never had similar symptoms before. Mrs. Stfort indicated that yesterday morning she was taking a shower and noticed sudden onset of tingling involving the right upper and lower lips as well as the right half of her tongue and right periocular-nose area. This occurred in association with tingling of the left V, IV fingers. That episode lasted for approximately 30 minutes and then around 7 pm last night had another episode " milder and without tingling in my tongue". Denied HA, vertigo, double vision, difficulty swallowing, focal weakness, slurred speech, unsteadiness, confusion, language or speech involvement but still with episodes of right lip and left index finger tingling. CT brain revealed no acute abnormality. Started on aspirin. Date last known well:  Time last known well:  tPA Given: no, out of the window, intermittent symptoms.   Past Medical History  Diagnosis Date  . Hypertension   . Depression   . COPD (chronic obstructive pulmonary disease)   . Pneumonia   . Sleep apnea   . GERD (gastroesophageal reflux disease)   . H/O hiatal hernia   . Headache(784.0)   . Anemia     Past Surgical History  Procedure Laterality Date  . Cholecystectomy    . Appendectomy    . Dilation and curettage of uterus      Family History  Problem Relation Age of Onset  . Colon cancer Maternal Grandmother   . Stroke Father    Social History:  reports that she quit smoking about 16 years ago. Her smoking use included  Cigarettes. She has a 60 pack-year smoking history. She has never used smokeless tobacco. She reports that she does not drink alcohol or use illicit drugs.  Allergies:  Allergies  Allergen Reactions  . Aspirin     Acid reflux- irritates  . Amoxicillin Rash  . Augmentin [Amoxicillin-Pot Clavulanate] Rash    Medications:  I have reviewed the patient's current medications.  ROS:                                                                                                                                       History obtained from the patient  General ROS: negative for - chills, fatigue, fever, night sweats, weight gain or weight loss Psychological ROS: negative for - behavioral disorder, hallucinations, memory difficulties, or suicidal ideation Ophthalmic ROS: negative for - blurry vision, double vision, eye pain or loss of vision ENT ROS: negative for - epistaxis, nasal discharge, oral lesions, sore throat, tinnitus or vertigo Allergy and Immunology ROS: negative for - hives or itchy/watery eyes Hematological and Lymphatic ROS: negative for - bleeding problems, bruising or swollen lymph nodes Endocrine ROS: negative for - galactorrhea, hair pattern changes, polydipsia/polyuria or temperature intolerance Respiratory ROS: negative for - cough, hemoptysis, shortness of breath or wheezing Cardiovascular ROS: negative for - chest pain, dyspnea on exertion, edema or irregular heartbeat Gastrointestinal ROS: negative for - abdominal pain, diarrhea, hematemesis, nausea/vomiting or stool incontinence Genito-Urinary ROS: negative for - dysuria, hematuria, incontinence or urinary frequency/urgency Musculoskeletal ROS: negative for - joint swelling or muscular weakness Neurological ROS: as noted in HPI Dermatological ROS: negative for rash and skin lesion changes  Physical  exam: pleasant female in no apparent distress. Blood pressure 148/73, pulse 65, temperature 98.2 F (36.8 C), temperature source Oral, resp. rate 20, height _0  (1.6 m), weight 72.757 kg (160 lb 6.4 oz), SpO2 99.00%. Head: normocephalic. Neck: supple, no bruits, no JVD. Cardiac: no murmurs. Lungs: clear. Abdomen: soft, no tender, no mass. Extremities: no edema. Neurologic Examination:                                                                                                      Mental Status: Alert, oriented, thought content appropriate.  Speech fluent without evidence of aphasia.  Able to follow 3 step commands without difficulty. Cranial Nerves: II: Discs flat bilaterally; Visual fields grossly normal, pupils equal, round, reactive to light and accommodation III,IV, VI: ptosis not present, extra-ocular motions intact bilaterally V,VII: smile symmetric, facial light touch sensation normal bilaterally VIII: hearing normal bilaterally IX,X: gag reflex present XI: bilateral shoulder shrug XII: midline tongue extension without atrophy or fasciculations  Motor: Right : Upper extremity   5/5    Left:     Upper extremity   5/5  Lower extremity   5/5     Lower extremity  5/5 Tone and bulk:normal tone throughout; no atrophy noted Sensory: Pinprick and light touch intact throughout, bilaterally Deep Tendon Reflexes:  Right: Upper Extremity   Left: Upper extremity   biceps (C-5 to C-6) 2/4   biceps (C-5 to C-6) 2/4 tricep (C7) 2/4    triceps (C7) 2/4 Brachioradialis (C6) 2/4  Brachioradialis (C6) 2/4  Lower Extremity Lower Extremity  quadriceps (L-2 to L-4) 2/4   quadriceps (L-2 to L-4) 2/4 Achilles (S1) 2/4   Achilles (S1) 2/4  Plantars: Right: downgoing   Left: downgoing Cerebellar: normal finger-to-nose,  normal heel-to-shin test Gait:  No tested.    Results for orders placed during the hospital encounter of 01/24/14 (from the past 48 hour(s))  CBC     Status: None    Collection Time    01/24/14  9:29 PM      Result Value Ref Range   WBC 9.3  4.0 - 10.5 K/uL   RBC 4.63  3.87 - 5.11 MIL/uL   Hemoglobin 12.5  12.0 - 15.0 g/dL   HCT 37.7  36.0 - 46.0 %   MCV 81.4  78.0 - 100.0 fL   MCH 27.0  26.0 - 34.0 pg   MCHC 33.2  30.0 - 36.0 g/dL   RDW 12.9  11.5 - 15.5 %   Platelets 183  150 - 400 K/uL  BASIC METABOLIC PANEL     Status: Abnormal   Collection Time    01/24/14  9:29 PM      Result Value Ref Range   Sodium 135 (*) 137 - 147 mEq/L   Potassium 4.3  3.7 - 5.3 mEq/L   Chloride 96  96 - 112 mEq/L   CO2 24  19 - 32 mEq/L   Glucose, Bld 129 (*) 70 - 99 mg/dL   BUN 8  6 - 23 mg/dL   Creatinine, Ser 1.06  0.50 - 1.10 mg/dL   Calcium 9.7  8.4 - 10.5 mg/dL   GFR calc non Af Amer 54 (*) >90 mL/min   GFR calc Af Amer 63 (*) >90 mL/min   Comment: (NOTE)     The eGFR has been calculated using the CKD EPI equation.     This calculation has not been validated in all clinical situations.     eGFR's persistently <90 mL/min signify possible Chronic Kidney     Disease.   Anion gap 15  5 - 15  PROTIME-INR     Status: None   Collection Time    01/24/14  9:29 PM      Result Value Ref Range   Prothrombin Time 13.3  11.6 - 15.2 seconds   INR 1.01  0.00 - 1.49  APTT     Status: None   Collection Time    01/24/14  9:29 PM      Result Value Ref Range   aPTT 29  24 - 37 seconds  ETHANOL     Status: None   Collection Time    01/24/14  9:29 PM      Result Value Ref Range   Alcohol, Ethyl (B) <11  0 - 11 mg/dL   Comment:            LOWEST DETECTABLE LIMIT FOR     SERUM ALCOHOL IS 11 mg/dL     FOR MEDICAL PURPOSES ONLY  TROPONIN I     Status: None   Collection Time    01/24/14  9:29 PM      Result Value Ref  Range   Troponin I <0.30  <0.30 ng/mL   Comment:            Due to the release kinetics of cTnI,     a negative result within the first hours     of the onset of symptoms does not rule out     myocardial infarction with certainty.     If myocardial  infarction is still suspected,     repeat the test at appropriate intervals.  URINE RAPID DRUG SCREEN (HOSP PERFORMED)     Status: None   Collection Time    01/24/14  9:48 PM      Result Value Ref Range   Opiates NONE DETECTED  NONE DETECTED   Cocaine NONE DETECTED  NONE DETECTED   Benzodiazepines NONE DETECTED  NONE DETECTED   Amphetamines NONE DETECTED  NONE DETECTED   Tetrahydrocannabinol NONE DETECTED  NONE DETECTED   Barbiturates NONE DETECTED  NONE DETECTED   Comment:            DRUG SCREEN FOR MEDICAL PURPOSES     ONLY.  IF CONFIRMATION IS NEEDED     FOR ANY PURPOSE, NOTIFY LAB     WITHIN 5 DAYS.                LOWEST DETECTABLE LIMITS     FOR URINE DRUG SCREEN     Drug Class       Cutoff (ng/mL)     Amphetamine      1000     Barbiturate      200     Benzodiazepine   761     Tricyclics       950     Opiates          300     Cocaine          300     THC              50  URINALYSIS, ROUTINE W REFLEX MICROSCOPIC     Status: Abnormal   Collection Time    01/24/14  9:48 PM      Result Value Ref Range   Color, Urine YELLOW  YELLOW   APPearance CLEAR  CLEAR   Specific Gravity, Urine 1.004 (*) 1.005 - 1.030   pH 5.5  5.0 - 8.0   Glucose, UA NEGATIVE  NEGATIVE mg/dL   Hgb urine dipstick NEGATIVE  NEGATIVE   Bilirubin Urine NEGATIVE  NEGATIVE   Ketones, ur NEGATIVE  NEGATIVE mg/dL   Protein, ur NEGATIVE  NEGATIVE mg/dL   Urobilinogen, UA 0.2  0.0 - 1.0 mg/dL   Nitrite NEGATIVE  NEGATIVE   Leukocytes, UA TRACE (*) NEGATIVE  URINE MICROSCOPIC-ADD ON     Status: Abnormal   Collection Time    01/24/14  9:48 PM      Result Value Ref Range   Squamous Epithelial / LPF FEW (*) RARE   WBC, UA 0-2  <3 WBC/hpf   Bacteria, UA RARE  RARE   Urine-Other RARE YEAST     Ct Head Wo Contrast  01/24/2014   CLINICAL DATA:  Numbness in tongue and lips.  EXAM: CT HEAD WITHOUT CONTRAST  TECHNIQUE: Contiguous axial images were obtained from the base of the skull through the vertex without  intravenous contrast.  COMPARISON:  CT of the sinuses May 27, 2013  FINDINGS: The ventricles and sulci are normal for age. No intraparenchymal hemorrhage, mass effect nor midline shift. Patchy supratentorial  white matter hypodensities are greater than expected for patient's age and though non-specific suggest sequelae of chronic small vessel ischemic disease. No acute large vascular territory infarcts.  No abnormal extra-axial fluid collections. Basal cisterns are patent. Mild calcific atherosclerosis of the carotid siphons.  No skull fracture. The included ocular globes and orbital contents are non-suspicious. The mastoid aircells and included paranasal sinuses are well-aerated.  IMPRESSION: No acute intracranial process. If clinical concern for acute ischemia, MRI of the brain with diffusion-weighted sequences would be more sensitive.  Mild to moderate white matter changes suggest chronic small vessel ischemic disease.   Electronically Signed   By: Elon Alas   On: 01/24/2014 22:51   Assessment: 63 y.o. female with new onset transient right lips-right side of the tongue-periocular and left fingers tingling. Neuro-exam unremarkable. CT brain negative for acute abnormality. Very stereotype episodes, ? TIA, ? sensory partial simple seizure. Has a history of migraine. On aspirin. Agree with TIA work up. EEG. Will follow up.  Dorian Pod, MD [facility] [phone]  01/25/2014, 5:02 AM

## 2014-01-25 NOTE — Progress Notes (Signed)
Discharge instructions given. Pt verbalized understanding and all questions were answered.  

## 2014-01-25 NOTE — Progress Notes (Signed)
*  PRELIMINARY RESULTS* Vascular Ultrasound Carotid Duplex (Doppler) has been completed.  Findings suggest 1-39% internal carotid artery stenosis bilaterally. Vertebral arteries are patent with antegrade flow.  01/25/2014 11:42 AM Gertie FeyMichelle Aiven Kampe, RVT, RDCS, RDMS

## 2014-01-25 NOTE — Progress Notes (Signed)
OT Cancellation Note  Patient Details Name: Mallory KempsJo Ann West MRN: [id number] DOB: 01/14/1951   Cancelled Treatment:    Reason Eval/Treat Not Completed: Patient not medically ready - orders start 01/26/14.  Will eval tomorrow per orders.  Angelene GiovanniConarpe, Denina Rieger M Gemayel Mascio Aledoonarpe, OTR/L [phone]  01/25/2014, 10:16 AM

## 2014-01-25 NOTE — ED Notes (Signed)
[facility] called to reasses what time they would be coming. 2 trucks ahead of pt.

## 2014-01-25 NOTE — ED Notes (Signed)
Bed: WA17 Expected date:  Expected time:  Means of arrival:  Comments: Room 3

## 2014-01-25 NOTE — Progress Notes (Signed)
Pt arrived with [facility] via stretcher. Alert and oriented x4. Tingling noted to be in right side of lip. No distress noted. Admission completed. Will continue to monitor. Herma ArdMesser, Katherine Syme RN BSN

## 2014-01-25 NOTE — Discharge Summary (Signed)
Physician Discharge Summary  [facility] VHQ:[id number]:[id number] DOB: [birth year] DOA: 01/24/2014  PCP: Gretel AcreNNODI, ADAKU, MD  Admit date: 01/24/2014 Discharge date: 01/25/2014  Time spent: 35 minutes  Recommendations for Outpatient Follow-up:  1. New medications: Lisinopril 2.5 mg by mouth daily 2. New medications: Aspirin 81 mg by mouth daily 3. New medication: Lipitor 10 mg by mouth each bedtime 4. Patient will follow up with her primary care physician in the next month  Discharge Diagnoses:  Principal Problem:   Acute CVA (cerebrovascular accident) Active Problems:   HTN (hypertension)   History of tobacco abuse   Chronic diastolic heart failure   Depression   Other and unspecified hyperlipidemia   COPD (chronic obstructive pulmonary disease)   OSA (obstructive sleep apnea)   Discharge Condition: improved, being discharged home  Diet recommendation: heart healthy  Filed Weights   01/24/14 1958 01/25/14 0404  Weight: 71.668 kg (158 lb) 72.757 kg (160 lb 6.4 oz)    History of present illness:  63 year old white female past medical history of hypertension presented to the emergency room on 7/5 with complaints of right facial and right hand numbness. First episode occurred earlier that day lasting for 20 minutes, the second one lasted for approximately one hour.CT scan of the head was negative andpatient's lab work was unremarkable. She was admitted to the hospitalist service. Neurology was consulted.  Hospital Course:  Principal Problem:   Acute CVA (cerebrovascular accident): Patient underwent MRI which did confirm acute infarct in the left capsule. Direct the patient's work was unrevealing including Dopplers and echocardiogram negative for clot. Patient was noted to have significant hyperlipidemia and started on statin. She likely has small vessel disease. She's also started on a daily aspirin.her symptoms overall have persisted with left arm numbness. Her facial numbness has resolved. She had  no focal deficits and did not need any physical therapy intervention. She passed a swallow evaluation on admission.  Active Problems:   HTN (hypertension): Patient states her blood pressures relatively well-controlled. Added low-dose lisinopril secondary to heart failure.    Chronic diastolic heart failure: Incidentally found, patient found to have grade 1 diastolic dysfunction on echocardiogram. She's already on beta blocker. BNP checked and found to be relatively normal at 417. Patient already on  Low-dose HCTZ. Patient received education packet and also added lisinopril 2.5 mg to her regimen.    Depression: Stable issue    Other and unspecified hyperlipidemia: Fasting lipid profile done following admission. LDL elevated at 139. Given her CVA, goal would be LDL less than 60. Patient started on Lipitor 10 mg each bedtime. Her PCP will follow    OSA (obstructive sleep apnea): Stable medical issue  Procedures:  EEG done 7/6: No evidence of seizures  Echocardiogram done 7/6: Grade 1 diastolic dysfunction  Carotid Dopplers done 7/6: Preliminary report no significant stenoses bilaterally  Consultations:  neurology  Discharge Exam: Filed Vitals:   01/25/14 1727  BP: 114/77  Pulse: 75  Temp: 97.8 F (36.6 C)  Resp: 20    General: Alert and oriented x3, no acute distress Cardiovascular: regular rate and rhythm, S1-S2 Respiratory: clear to auscultation bilaterally Neuro: No focal deficits  Discharge Instructions You were cared for by a hospitalist during your hospital stay. If you have any questions about your discharge medications or the care you received while you were in the hospital after you are discharged, you can call the unit and asked to speak with the hospitalist on call if the hospitalist that took care of  you is not available. Once you are discharged, your primary care physician will handle any further medical issues. Please note that NO REFILLS for any discharge  medications will be authorized once you are discharged, as it is imperative that you return to your primary care physician (or establish a relationship with a primary care physician if you do not have one) for your aftercare needs so that they can reassess your need for medications and monitor your lab values.  Discharge Instructions   Diet - low sodium heart healthy    Complete by:  As directed      Increase activity slowly    Complete by:  As directed             Medication List         acetaminophen 500 MG tablet  Commonly known as:  TYLENOL  Take 1,000 mg by mouth every 6 (six) hours as needed for fever.     aspirin 81 MG tablet  Take 1 tablet (81 mg total) by mouth daily.     atorvastatin 10 MG tablet  Commonly known as:  LIPITOR  Take 1 tablet (10 mg total) by mouth daily at 6 PM.     bisoprolol-hydrochlorothiazide 5-6.25 MG per tablet  Commonly known as:  ZIAC  Take 1 tablet by mouth daily.     buPROPion 100 MG 12 hr tablet  Commonly known as:  WELLBUTRIN SR  Take 300 mg by mouth daily.     cyclobenzaprine 10 MG tablet  Commonly known as:  FLEXERIL  Take 10 mg by mouth at bedtime.     dexlansoprazole 60 MG capsule  Commonly known as:  DEXILANT  Take 60 mg by mouth daily.     docusate sodium 50 MG capsule  Commonly known as:  COLACE  Take 250 mg by mouth at bedtime.     FLUoxetine 20 MG tablet  Commonly known as:  PROZAC  Take 40 mg by mouth daily.     lisinopril 2.5 MG tablet  Commonly known as:  PRINIVIL,ZESTRIL  Take 1 tablet (2.5 mg total) by mouth daily.     MELATONIN PO  Take 1 tablet by mouth at bedtime as needed (sleep.).     methadone 10 MG tablet  Commonly known as:  DOLOPHINE  Take 30 mg by mouth 3 (three) times daily. 3 tabs at 6a, 3 tabs at 11a, 3 tabs at 4p     sulfamethoxazole-trimethoprim 800-160 MG per tablet  Commonly known as:  BACTRIM DS  Take 1 tablet by mouth 2 (two) times daily. For 7 days.       Allergies  Allergen  Reactions  . Aspirin     Acid reflux- irritates  . Amoxicillin Rash  . Augmentin [Amoxicillin-Pot Clavulanate] Rash       Follow-up Information   Follow up with NNODI, ADAKU, MD. Schedule an appointment as soon as possible for a visit in 1 month.   Specialty:  Family Medicine   Contact information:   [address] [place] [address] [phone]        The results of significant diagnostics from this hospitalization (including imaging, microbiology, ancillary and laboratory) are listed below for reference.    Significant Diagnostic Studies: Dg Chest 2 View  01/25/2014     IMPRESSION: Cardiomegaly and bronchitic change without acute cardiopulmonary disease.   Electronically Signed   By: Simonne Come M.D.   On: 01/25/2014 11:00   Ct Head Wo Contrast  01/24/2014  IMPRESSION: No acute intracranial process. If clinical concern for acute ischemia, MRI of the brain with diffusion-weighted sequences would be more sensitive.  Mild to moderate white matter changes suggest chronic small vessel ischemic disease.   Electronically Signed   By: Awilda Metroourtnay  Bloomer   On: 01/24/2014 22:51   Mr Brain Wo Contrast Mr Maxine GlennMra Head/brain Wo Cm  01/25/2014    IMPRESSION: Acute infarct left thalamus.  Moderate chronic microvascular ischemic changes in the white matter and pons.  MRA degraded by image quality.  Moderate stenosis right A2 segment.   Electronically Signed   By: Marlan Palauharles  Clark M.D.   On: 01/25/2014 12:05    Microbiology: No results found for this or any previous visit (from the past 240 hour(s)).   Labs: Basic Metabolic Panel:  Recent Labs Lab 01/24/14 2129 01/25/14 0647  NA 135*  --   K 4.3  --   CL 96  --   CO2 24  --   GLUCOSE 129*  --   BUN 8  --   CREATININE 1.06 0.99  CALCIUM 9.7  --    Liver Function Tests: No results found for this basename: AST, ALT, ALKPHOS, BILITOT, PROT, ALBUMIN,  in the last 168 hours No results found for this basename: LIPASE, AMYLASE,  in  the last 168 hours No results found for this basename: AMMONIA,  in the last 168 hours CBC:  Recent Labs Lab 01/24/14 2129 01/25/14 0647  WBC 9.3 6.6  HGB 12.5 12.8  HCT 37.7 39.7  MCV 81.4 83.8  PLT 183 173   Cardiac Enzymes:  Recent Labs Lab 01/24/14 2129  TROPONINI <0.30   BNP: BNP (last 3 results)  Recent Labs  01/25/14 1710  PROBNP 417.0*   CBG: No results found for this basename: GLUCAP,  in the last 168 hours     Signed:  Hollice EspyKRISHNAN,Clydell Alberts K  [facility] 01/25/2014, 7:07 PM

## 2014-01-25 NOTE — Procedures (Signed)
ELECTROENCEPHALOGRAM REPORT  Date of Study: 01/25/2014  Patient's Name: Mallory West MRN: [id number] Date of Birth: 1951-07-15  Referring Provider: Dr. Wyatt Portelasvaldo Camilo  Clinical History: This is a 63 year old woman with recurrent episodes of tingling in the right upper and lower lips or, right half of her tongue, right periocular and nose area, as well as her left hand.  Medications: Aspirin, Ziac, Wellbutrin, methadone  Technical Summary: A multichannel digital EEG recording measured by the international 10-20 system with electrodes applied with paste and impedances below 5000 ohms performed in our laboratory with EKG monitoring in an awake and asleep patient.  Hyperventilation and photic stimulation were not performed.  The digital EEG was referentially recorded, reformatted, and digitally filtered in a variety of bipolar and referential montages for optimal display.    Description: The patient is awake and asleep during the recording.  During maximal wakefulness, there is a symmetric, medium voltage 9 Hz posterior dominant rhythm that attenuates with eye opening.  The record is symmetric.  During drowsiness and sleep, there is an increase in theta slowing of the background.  Vertex waves and symmetric sleep spindles were seen.  Hyperventilation and photic stimulation were not performed. There were no epileptiform discharges or electrographic seizures seen.    EKG lead was unremarkable.  Impression: This awake and asleep EEG is normal.    Clinical Correlation: A normal EEG does not exclude a clinical diagnosis of epilepsy.  Clinical correlation is advised.   Patrcia DollyKaren Rea Reser, M.D.

## 2014-01-25 NOTE — ED Notes (Signed)
[facility] called for transport. Report given to Ramon DredgeEllie, 4N RN.

## 2014-01-25 NOTE — H&P (Signed)
[facility]                                                                                    Patient Demographics  Mallory West, is a 63 y.o. female  CSN: [id number]  MRN: [id number]  DOB - [birth year]  Admit Date - 01/24/2014  Outpatient Primary MD for the patient is Pcp Not In System   With History of -  Past Medical History  Diagnosis Date  . Hypertension   . Depression   . COPD (chronic obstructive pulmonary disease)   . Pneumonia   . Sleep apnea   . GERD (gastroesophageal reflux disease)   . H/O hiatal hernia   . Headache(784.0)   . Anemia       Past Surgical History  Procedure Laterality Date  . Cholecystectomy    . Appendectomy    . Dilation and curettage of uterus      in for   Chief Complaint  Patient presents with  . Numbness     HPI  Mallory West  is a 63 y.o. female, hypertension and a prolonged QT syndrome, presenting with 2 episodes of right facial numbness and fingers numbness. The first one occurred earlier in a.m. and lasted 20 minutes. It involved the right periocular area and the upper part of the nose and all 5 fingers but no change in speech, vision or headache the second one returned to at 7 PM and lasted approximately one hour there was also no focal weakness, palpitations and no alleviating or modifying factors. Patient denies any history of stroke, atrial fibrillation or pacemaker placement in the past    Review of Systems    In addition to the HPI above,  No Fever-chills, No Headache, No changes with Vision or hearing, No problems swallowing food or Liquids, No Chest pain, Cough or Shortness of Breath, No Abdominal pain, No Nausea or Vommitting, Bowel movements are regular, No Blood in stool or Urine, No dysuria, No new skin rashes or bruises, No new joints pains-aches,  No recent weight gain or loss, No polyuria, polydypsia or polyphagia, No significant Mental Stressors.  A full 10 point Review of Systems was  done, except as stated above, all other Review of Systems were negative.   Social History History  Substance Use Topics  . Smoking status: Former Smoker -- 2.00 packs/day for 30 years    Types: Cigarettes    Quit date: 01/23/1998  . Smokeless tobacco: Never Used  . Alcohol Use: No     Family History Family History  Problem Relation Age of Onset  . Colon cancer Maternal Grandmother   . Stroke Father      Prior to Admission medications   Medication Sig Start Date End Date Taking? Authorizing Provider  acetaminophen (TYLENOL) 500 MG tablet Take 1,000 mg by mouth every 6 (six) hours as needed for fever.   Yes Historical Provider, MD  bisoprolol-hydrochlorothiazide (ZIAC) 5-6.25 MG per tablet Take 1 tablet by mouth daily.   Yes Historical Provider, MD  buPROPion (WELLBUTRIN SR) 100 MG 12 hr tablet Take 300 mg by mouth daily.    Yes Historical Provider, MD  cyclobenzaprine (FLEXERIL) 10 MG tablet Take 10 mg by mouth at bedtime.   Yes Historical Provider, MD  dexlansoprazole (DEXILANT) 60 MG capsule Take 60 mg by mouth daily.   Yes Historical Provider, MD  docusate sodium (COLACE) 50 MG capsule Take 250 mg by mouth at bedtime.   Yes Historical Provider, MD  FLUoxetine (PROZAC) 20 MG tablet Take 40 mg by mouth daily.   Yes Historical Provider, MD  MELATONIN PO Take 1 tablet by mouth at bedtime as needed (sleep.).   Yes Historical Provider, MD  methadone (DOLOPHINE) 10 MG tablet Take 30-60 mg by mouth every 8 (eight) hours. Take six in the morning and three at night   Yes Historical Provider, MD  sulfamethoxazole-trimethoprim (BACTRIM DS) 800-160 MG per tablet Take 1 tablet by mouth 2 (two) times daily. For 7 days. 01/18/14  Yes Historical Provider, MD    Allergies  Allergen Reactions  . Aspirin     Acid reflux- irritates  . Amoxicillin Rash  . Augmentin [Amoxicillin-Pot Clavulanate] Rash    Physical Exam  Vitals  Blood pressure 174/71, temperature 98.5 F (36.9 C), temperature  source Oral, resp. rate 27, height 5\' 3"  (1.6 m), weight 71.668 kg (158 lb), SpO2 100.00%.   1. General elderly white female, very pleasant in no acute distress at this time, mildly anxious  2. Normal affect and insight, Not Suicidal or Homicidal, Awake Alert, Oriented X 3.  3. No F.N deficits, ALL C.Nerves Intact, Strength 5/5 all 4 extremities, Sensation intact all 4 extremities,   4. Ears and Eyes appear Normal, Conjunctivae clear, PERRLA. Moist Oral Mucosa.  5. Supple Neck, No JVD, No cervical lymphadenopathy appriciated, No Carotid Bruits.  6. Symmetrical Chest wall movement, Good air movement bilaterally, CTAB.  7. RRR, No Gallops, Rubs or Murmurs, No Parasternal Heave.  8. Positive Bowel Sounds, Abdomen Soft, Non tender, No organomegaly appriciated,No rebound -guarding or rigidity.  9.  No Cyanosis, Normal Skin Turgor, No Skin Rash or Bruise.  10. Good muscle tone,  joints appear normal , no effusions, Normal ROM.  11. No Palpable Lymph Nodes in Neck or Axillae    Data Review  CBC  Recent Labs Lab 01/24/14 2129  WBC 9.3  HGB 12.5  HCT 37.7  PLT 183  MCV 81.4  MCH 27.0  MCHC 33.2  RDW 12.9   ------------------------------------------------------------------------------------------------------------------  Chemistries   Recent Labs Lab 01/24/14 2129  NA 135*  K 4.3  CL 96  CO2 24  GLUCOSE 129*  BUN 8  CREATININE 1.06  CALCIUM 9.7   ------------------------------------------------------------------------------------------------------------------ estimated creatinine clearance is 50.9 ml/min (by C-G formula based on Cr of 1.06). ------------------------------------------------------------------------------------------------------------------ No results found for this basename: TSH, T4TOTAL, FREET3, T3FREE, THYROIDAB,  in the last 72 hours   Coagulation profile  Recent Labs Lab 01/24/14 2129  INR 1.01    ------------------------------------------------------------------------------------------------------------------- No results found for this basename: DDIMER,  in the last 72 hours -------------------------------------------------------------------------------------------------------------------  Cardiac Enzymes  Recent Labs Lab 01/24/14 2129  TROPONINI <0.30   ------------------------------------------------------------------------------------------------------------------ No components found with this basename: POCBNP,    ---------------------------------------------------------------------------------------------------------------  Urinalysis    Component Value Date/Time   COLORURINE YELLOW 01/24/2014 2148   APPEARANCEUR CLEAR 01/24/2014 2148   LABSPEC 1.004* 01/24/2014 2148   PHURINE 5.5 01/24/2014 2148   GLUCOSEU NEGATIVE 01/24/2014 2148   HGBUR NEGATIVE 01/24/2014 2148   BILIRUBINUR NEGATIVE 01/24/2014 2148   KETONESUR NEGATIVE 01/24/2014 2148   PROTEINUR NEGATIVE 01/24/2014 2148   UROBILINOGEN 0.2 01/24/2014 2148   NITRITE  NEGATIVE 01/24/2014 2148   LEUKOCYTESUR TRACE* 01/24/2014 2148    ----------------------------------------------------------------------------------------------------------------  Imaging results:   Ct Head Wo Contrast  01/24/2014   CLINICAL DATA:  Numbness in tongue and lips.  EXAM: CT HEAD WITHOUT CONTRAST  TECHNIQUE: Contiguous axial images were obtained from the base of the skull through the vertex without intravenous contrast.  COMPARISON:  CT of the sinuses May 27, 2013  FINDINGS: The ventricles and sulci are normal for age. No intraparenchymal hemorrhage, mass effect nor midline shift. Patchy supratentorial white matter hypodensities are greater than expected for patient's age and though non-specific suggest sequelae of chronic small vessel ischemic disease. No acute large vascular territory infarcts.  No abnormal extra-axial fluid collections. Basal cisterns  are patent. Mild calcific atherosclerosis of the carotid siphons.  No skull fracture. The included ocular globes and orbital contents are non-suspicious. The mastoid aircells and included paranasal sinuses are well-aerated.  IMPRESSION: No acute intracranial process. If clinical concern for acute ischemia, MRI of the brain with diffusion-weighted sequences would be more sensitive.  Mild to moderate white matter changes suggest chronic small vessel ischemic disease.   Electronically Signed   By: Awilda Metroourtnay  Bloomer   On: 01/24/2014 22:51    My personal review of EKG: Normal sinus rhythm/LVH    Assessment & Plan  1. TIA with symptoms involving the right side of face and hand, recurring       Place on aspirin(her allergy to aspirin is due to her reflux which gets worse)      [facility]      MRI/MRA      Neurology consult 2. Hypertension     Slightly elevated     Keep as such   DVT Prophylaxis Lovenox  AM Labs Ordered, also please review Full Orders  Family Communication: Admission, patients condition and plan of care including tests being ordered have been discussed with the patient and husband who indicate understanding and agree with the plan and Code Status.  Code Status full  Disposition Plan: Home  Time spent in minutes : 34 minutes  Condition GUARDED   @SIGNATURE @

## 2014-01-25 NOTE — Progress Notes (Signed)
  Echocardiogram 2D Echocardiogram has been performed.  Mallory West 01/25/2014, 12:05 PM 

## 2014-03-04 ENCOUNTER — Ambulatory Visit: Payer: BC Managed Care – PPO | Admitting: Neurology

## 2014-03-16 ENCOUNTER — Ambulatory Visit: Payer: BC Managed Care – PPO | Admitting: Neurology

## 2014-03-18 ENCOUNTER — Encounter: Payer: Self-pay | Admitting: Neurology

## 2014-09-25 IMAGING — CT CT CHEST W/O CM
2 of 3 series · 15 of 36 positions shown, 18 images · non-contrast
Comparison: 04/24/2013 chest radiograph at [REDACTED] at
[facility]

CLINICAL DATA: Left-sided chest pain. Recurrent pneumonia.
Shortness of breath.

EXAM:
CT CHEST WITHOUT CONTRAST
TECHNIQUE: Multidetector CT imaging of the chest was performed following the
standard protocol without IV contrast.

[Series 2: thorax 5.0 i31f 1 · axial · 0.73mm/px · z∈[+190,+420]mm · 12 of 56 slices shown, 15 images]
[im 5/56  mediastinal]
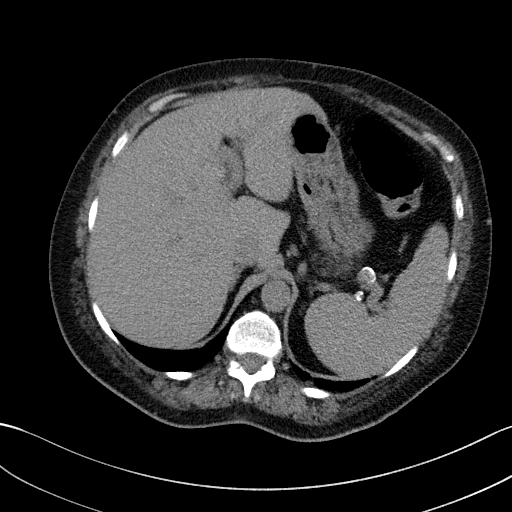
[im 5/56  lung]
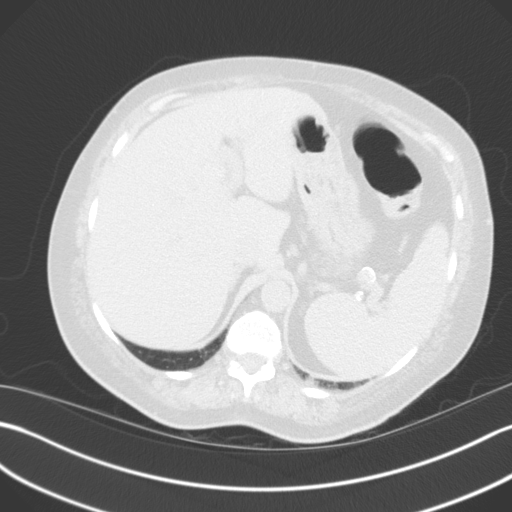
[im 9/56  lung]
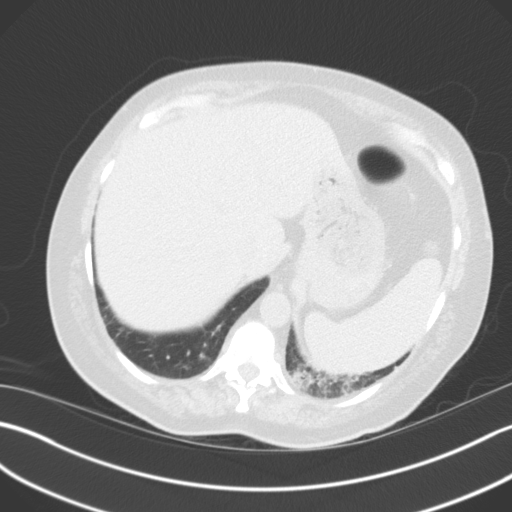
[im 13/56  lung]
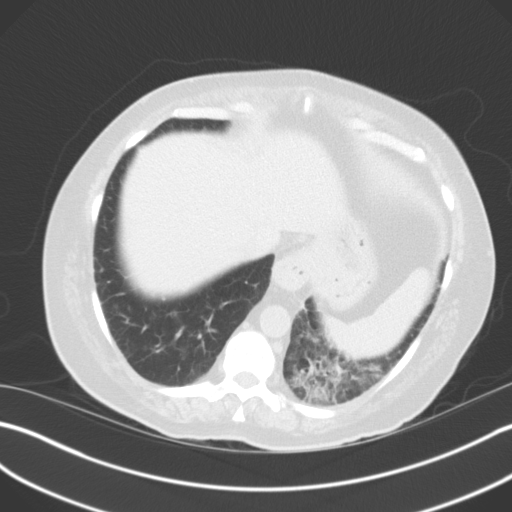
[im 17/56  lung]
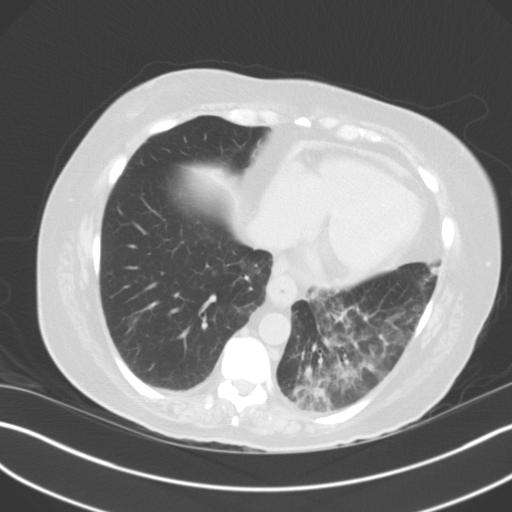
[im 21/56  mediastinal]
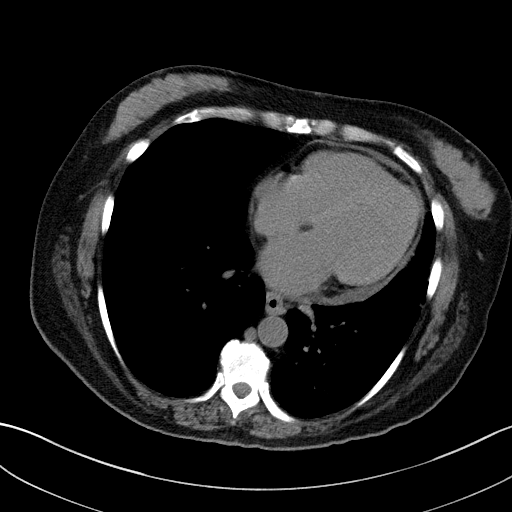
[im 21/56  lung]
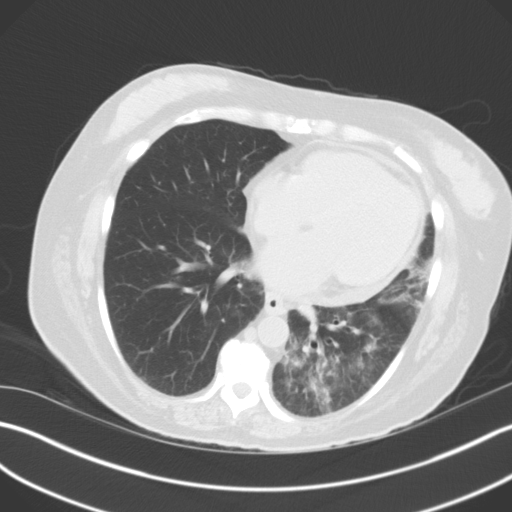
[im 25/56  lung]
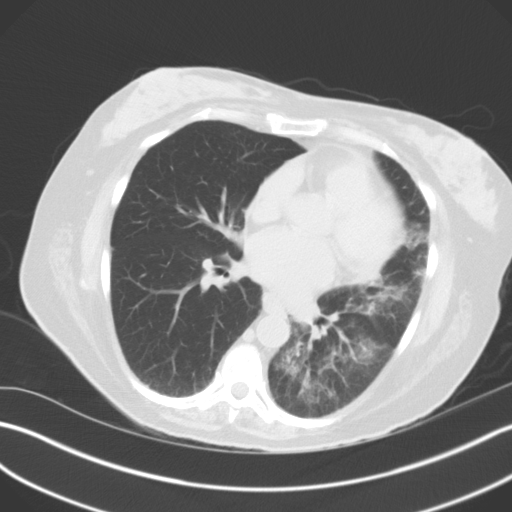
[im 31/56  lung]
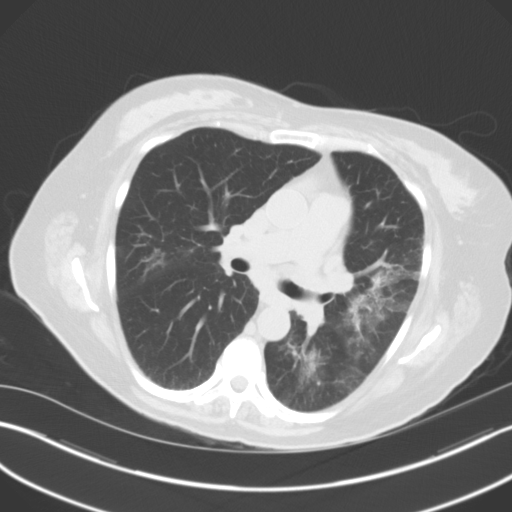
[im 35/56  lung]
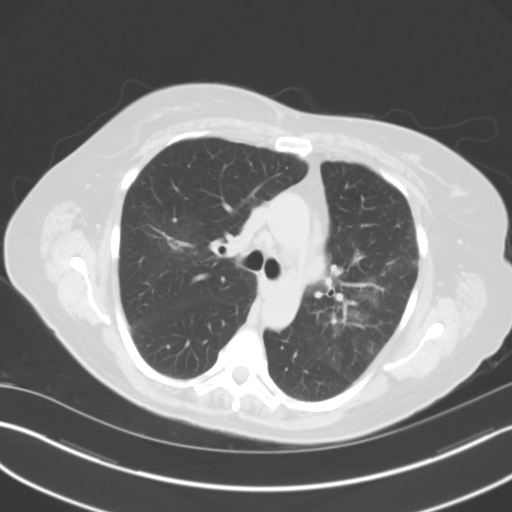
[im 39/56  mediastinal]
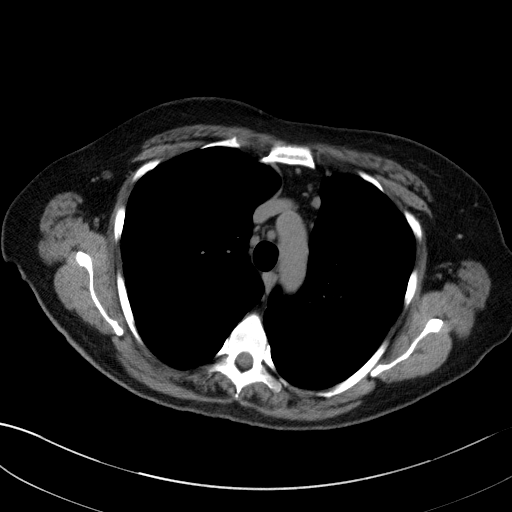
[im 39/56  lung]
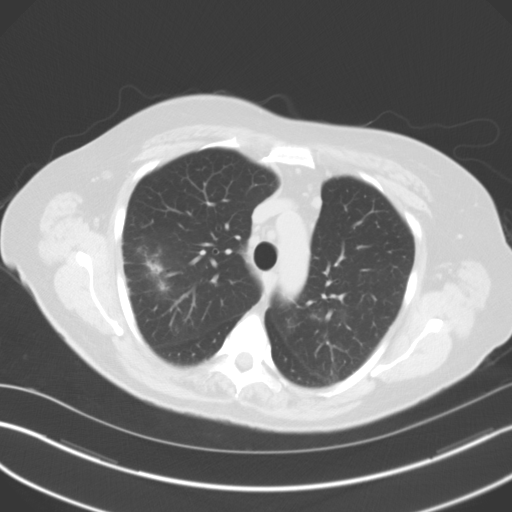
[im 43/56  lung]
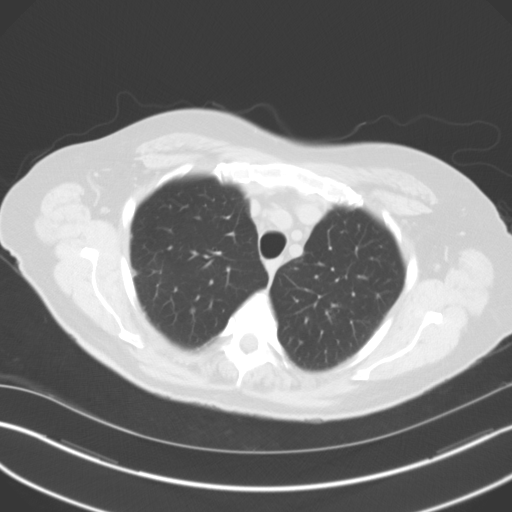
[im 47/56  lung]
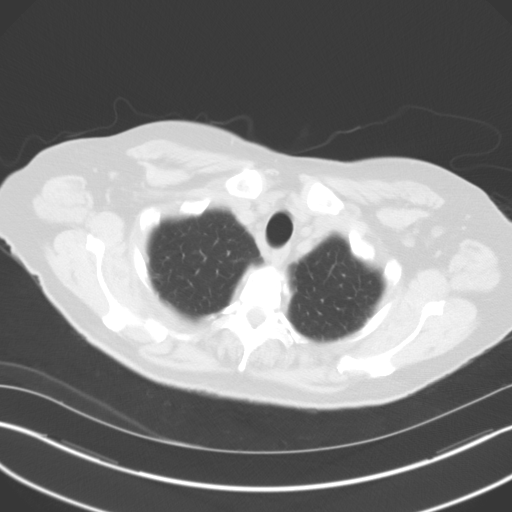
[im 51/56  lung]
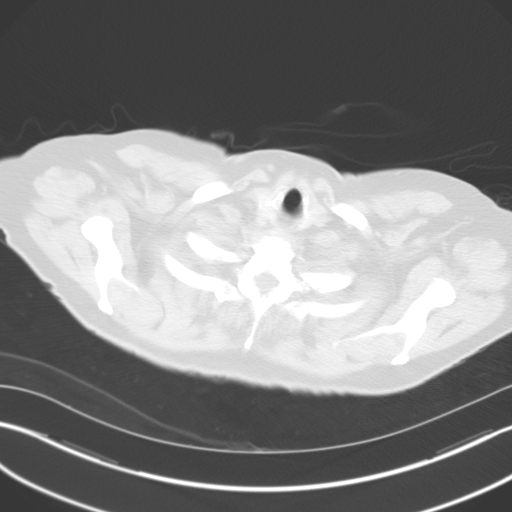

[Series 5: coronal · coronal · 0.55mm/px · 3 of 89 slices shown]
[im 18/89  lung]
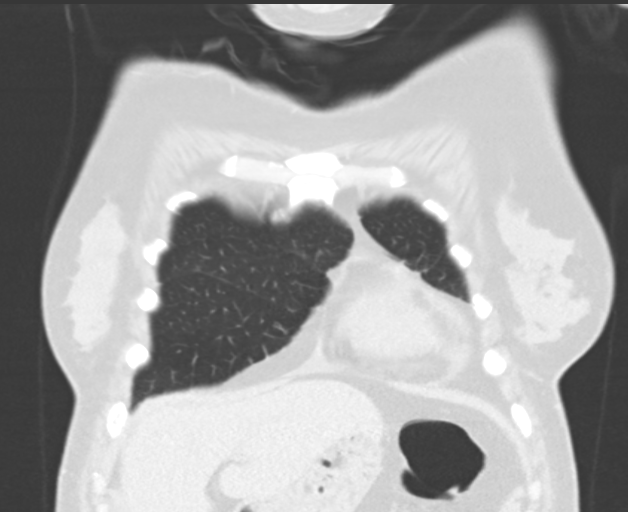
[im 36/89  lung]
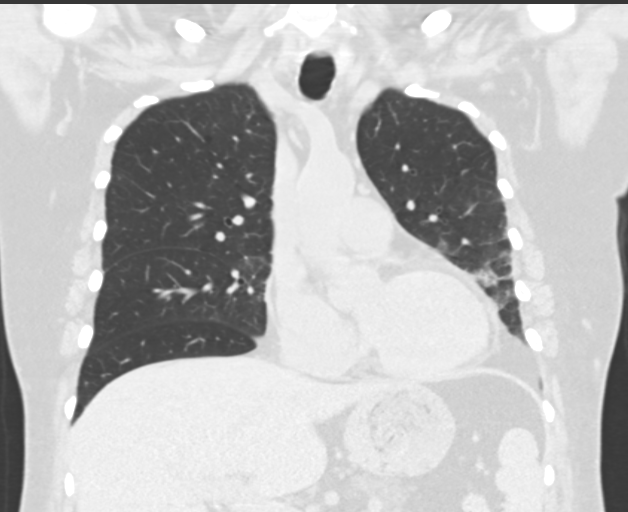
[im 53/89  lung]
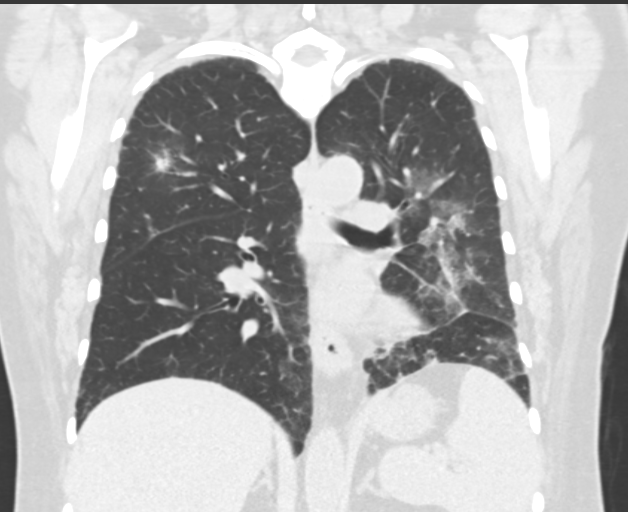

[15 of 36 positions shown; findings below may reference images not displayed]

FINDINGS: There are multiple, bilateral areas of patchy airspace opacity with
bronchial wall thickening and interspersed ground-glass airspace
opacity predominantly in the lingula and the left lower lobe. No
pleural effusion.

Small pleural and suspect small pericardial effusion is present.
Cholecystectomy clips partly visualized. Small hiatal hernia. Small
mediastinal lymph nodes are identified.

No acute osseous finding.
IMPRESSION: Multilobar patchy airspace opacities most prominent in the lingula
and left lower lobe with a configuration most typical for
bronchopneumonia. Aspiration or hemorrhage could have a similar
appearance. Radiographic resolution [REDACTED]weeks.

## 2015-06-28 IMAGING — CR DG CHEST 2V
2 series · 2 of 2 positions shown · non-contrast
Comparison: 06/24/2013; 04/24/2013; chest CT -04/24/2013

CLINICAL DATA: Stroke

EXAM:
CHEST  2 VIEW

[w chest pa]
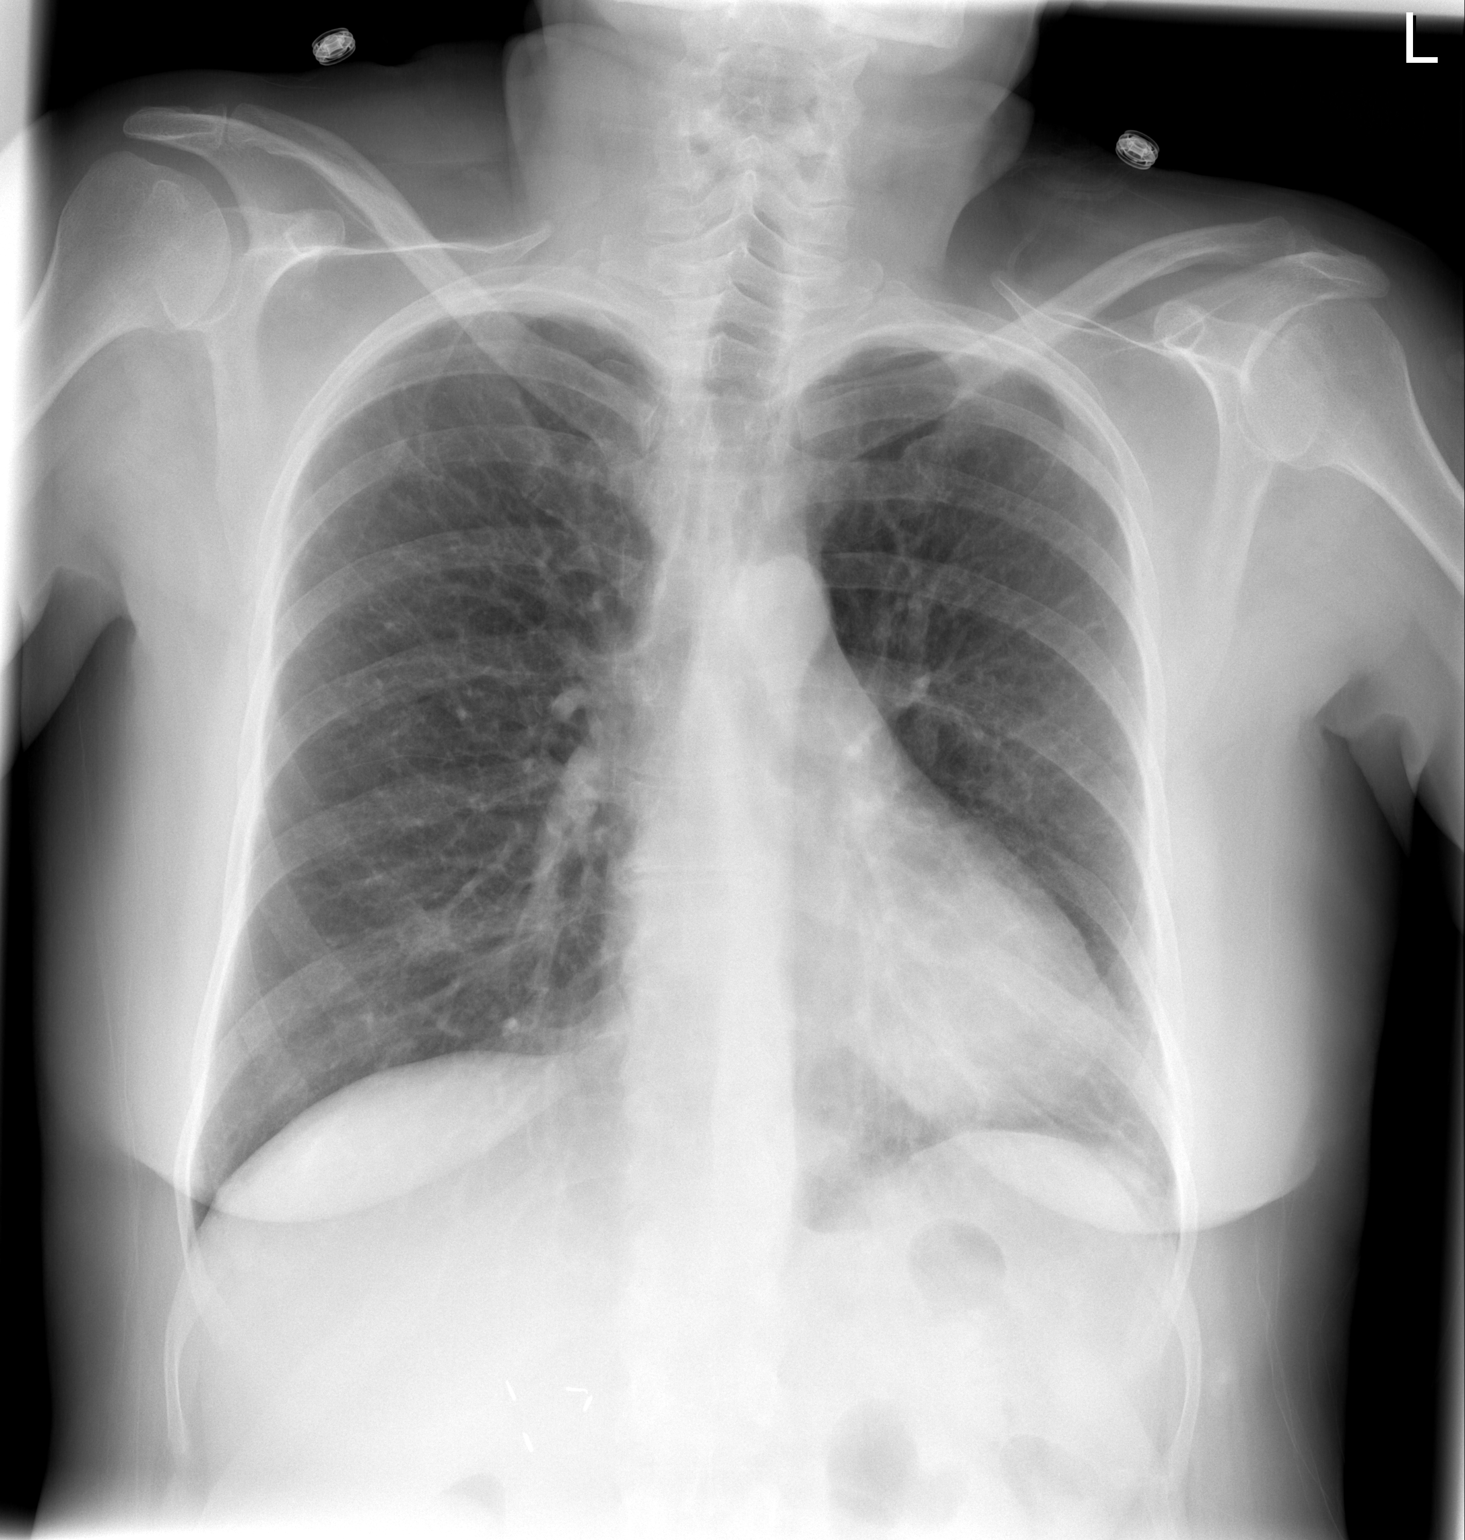

[w chest lat]
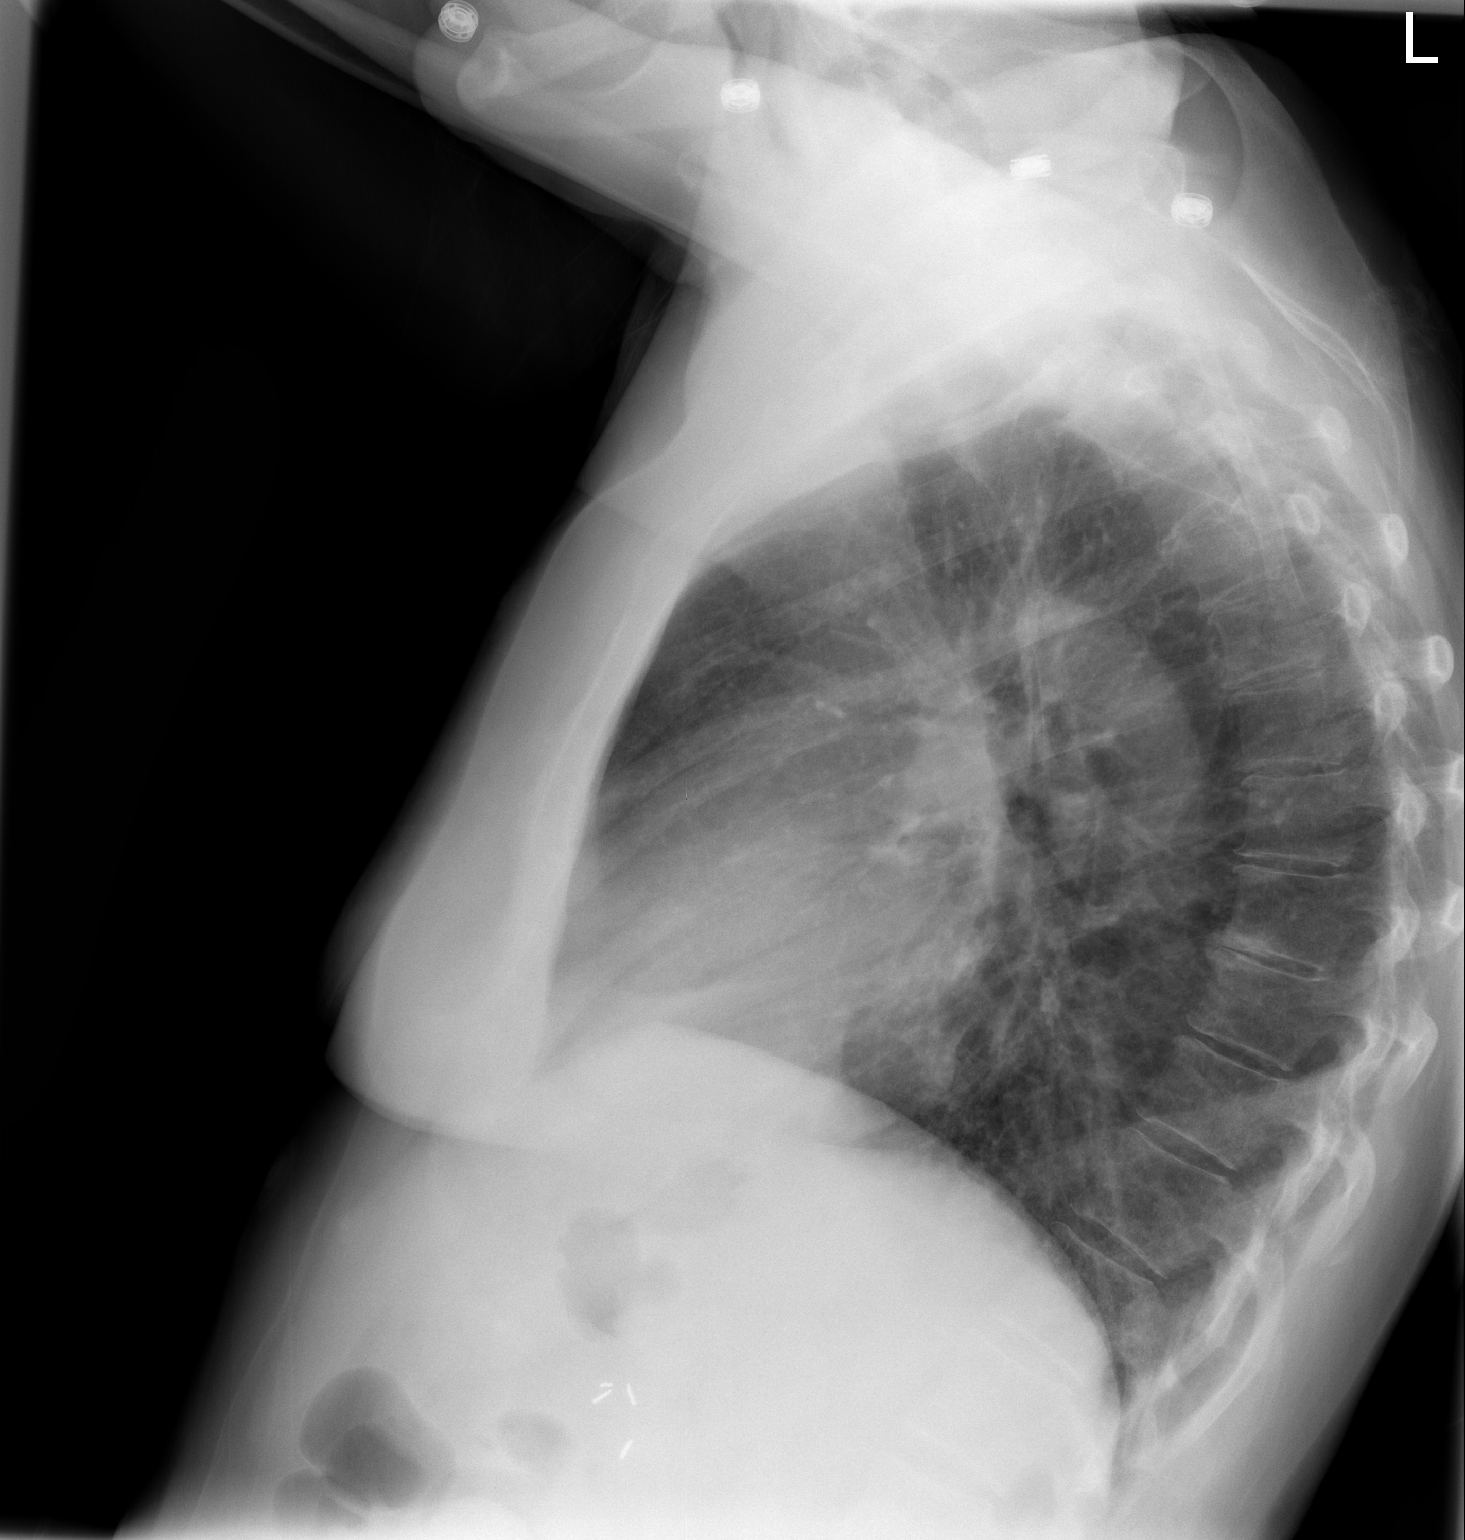

[2 of 2 positions shown; findings below may reference images not displayed]

FINDINGS: Grossly unchanged borderline enlarged cardiac silhouette and
mediastinal contours. The lungs remain MILDLY hyperexpanded with
diffuse slightly nodular thickening of the pulmonary interstitium.
Improved aeration of the left mid and lower lung. Minimal bibasilar
linear heterogeneous opacities, left greater than right, favored to
represent atelectasis or scar. No new discrete focal airspace
opacities. No pleural effusion or pneumothorax. No evidence of
edema. No acute osseus abnormalities. Post cholecystectomy.
IMPRESSION: Cardiomegaly and bronchitic change without acute cardiopulmonary
disease.
